# Patient Record
Sex: Female | Born: 1950
Health system: Southern US, Community
[De-identification: ages and names within clinical notes are randomized; demographics above are authoritative.]

## PROBLEM LIST (undated history)

## (undated) ENCOUNTER — Ambulatory Visit: Payer: Medicare HMO

## (undated) DIAGNOSIS — E669 Obesity, unspecified: Secondary | ICD-10-CM

## (undated) DIAGNOSIS — K439 Ventral hernia without obstruction or gangrene: Secondary | ICD-10-CM

## (undated) DIAGNOSIS — C801 Malignant (primary) neoplasm, unspecified: Secondary | ICD-10-CM

## (undated) HISTORY — DX: Obesity, unspecified: E66.9

## (undated) HISTORY — PX: OTHER SURGICAL HISTORY: SHX169

---

## 2004-12-26 ENCOUNTER — Emergency Department (HOSPITAL_COMMUNITY): Admission: EM | Admit: 2004-12-26 | Discharge: 2004-12-27 | Payer: Self-pay | Admitting: Emergency Medicine

## 2015-02-05 ENCOUNTER — Encounter (HOSPITAL_COMMUNITY): Payer: Self-pay | Admitting: *Deleted

## 2015-02-05 ENCOUNTER — Inpatient Hospital Stay (HOSPITAL_COMMUNITY): Payer: BLUE CROSS/BLUE SHIELD

## 2015-02-05 ENCOUNTER — Inpatient Hospital Stay (HOSPITAL_COMMUNITY)
Admission: AD | Admit: 2015-02-05 | Discharge: 2015-02-05 | Disposition: A | Discharge status: Home / Self Care | Payer: BLUE CROSS/BLUE SHIELD | Source: Ambulatory Visit | Attending: Obstetrics & Gynecology | Admitting: Obstetrics & Gynecology

## 2015-02-05 DIAGNOSIS — N950 Postmenopausal bleeding: Secondary | ICD-10-CM | POA: Diagnosis not present

## 2015-02-05 DIAGNOSIS — N939 Abnormal uterine and vaginal bleeding, unspecified: Secondary | ICD-10-CM | POA: Diagnosis present

## 2015-02-05 LAB — CBC
HCT: 29.5 % — ABNORMAL LOW (ref 36.0–46.0)
HEMOGLOBIN: 8.9 g/dL — AB (ref 12.0–15.0)
MCH: 25.7 pg — ABNORMAL LOW (ref 26.0–34.0)
MCHC: 30.2 g/dL (ref 30.0–36.0)
MCV: 85.3 fL (ref 78.0–100.0)
PLATELETS: 283 10*3/uL (ref 150–400)
RBC: 3.46 MIL/uL — ABNORMAL LOW (ref 3.87–5.11)
RDW: 15 % (ref 11.5–15.5)
WBC: 12.8 10*3/uL — ABNORMAL HIGH (ref 4.0–10.5)

## 2015-02-05 LAB — ABO/RH: ABO/RH(D): O NEG

## 2015-02-05 LAB — TYPE AND SCREEN
ABO/RH(D): O NEG
ANTIBODY SCREEN: NEGATIVE

## 2015-02-05 MED ORDER — MEGESTROL ACETATE 40 MG PO TABS: 40.0000 mg | ORAL_TABLET | Freq: Two times a day (BID) | ORAL | Status: DC

## 2015-02-05 NOTE — MAU Provider Note (Signed)
Chief Complaint:  Vaginal Bleeding   First Provider Initiated Contact with Patient 02/05/15 1834     HPI  Ashley Flores is a 65 y.o. [id number] who presents to maternity admissions reporting heavy vaginal bleeding since yesterday.  Has been having postmenopausal bleeding for about a year.  States went into menopause at about age 51 and then a year ago, started having regular bleeding. States they have been "normal" periods, not excessive.  Has mild intermittent cramping with it.  . She reports vaginal bleeding, but no vaginal itching/burning, urinary symptoms, h/a, dizziness, n/v, or fever/chills. Does complain of a bulging at her umbilicus.  Is the same all the time, never gets bigger or painful.   Denies any significant gynecologic history   Normal vaginal births years ago. No major diseases. Had one exploratory lap for pain which found nothing abnormal  Has not seen a doctor for anything in many years "I don't like doctors".  RN Note: Pt presents to MAU with complaints of heavy vaginal bleeding that started yesterday. Pt states she stopped having her cycle 10 years ago, but started having regular cycles again about a year ago. Started bleeding yesterday heavy with large clots. Denies any pain  Past Medical History: History reviewed. No pertinent past medical history.  Past obstetric history: OB History  Gravida Para Term Preterm AB SAB TAB Ectopic Multiple Living  2 2 2       2     # Outcome Date GA Lbr Len/2nd Weight Sex Delivery Anes PTL Lv  2 Term 08/06/78    F Vag-Spont   Y  1 Term 06/25/69    F Vag-Spont  N Y      Past Surgical History: History reviewed. No pertinent past surgical history.  Family History: History reviewed. No pertinent family history.  Social History: Social History  Substance Use Topics  . Smoking status: Never Smoker   . Smokeless tobacco: Never Used  . Alcohol Use: No    Allergies:  Allergies  Allergen Reactions  . Pyridium [Phenazopyridine Hcl]  Cough    Meds:  Prescriptions prior to admission  Medication Sig Dispense Refill Last Dose  . ibuprofen (ADVIL,MOTRIN) 200 MG tablet Take 400-600 mg by mouth every 6 (six) hours as needed for moderate pain.   Past Week at Unknown time    I have reviewed patient's Past Medical Hx, Surgical Hx, Family Hx, Social Hx, medications and allergies.  ROS:  Review of Systems  Constitutional: Positive for fatigue. Negative for fever, chills and appetite change.  Respiratory: Negative for chest tightness and shortness of breath.   Gastrointestinal: Negative for nausea, vomiting, abdominal pain, diarrhea and constipation.  Genitourinary: Positive for vaginal bleeding (heavy with clots), menstrual problem and pelvic pain (mild cramping). Negative for dysuria and vaginal discharge.  Musculoskeletal: Negative for myalgias and back pain.  Neurological: Positive for weakness. Negative for dizziness, light-headedness and headaches.   Other systems negative   Physical Exam  Patient Vitals for the past 24 hrs:  BP Temp Pulse Resp  02/05/15 1816 135/55 mmHg 98.6 F (37 C) 99 18   Filed Vitals:   02/05/15 1816 02/05/15 2048  BP: 135/55 151/60  Pulse: 99 96  Temp: 98.6 F (37 C) 99 F (37.2 C)  TempSrc:  Oral  Resp: 18 16    Constitutional: Well-developed, well-nourished female in no acute distress.  Cardiovascular: normal rate and rhythm, no ectopy audible, S1 & S2 heard, no murmur Respiratory: normal effort, no distress. Lungs CTAB with  no wheezes or crackles GI: Abd soft, non-tender.  Mildly distended vs obese.  No rebound, No guarding.  Bowel Sounds audible   There is a 3cm umbilical hernia, nonincarcerated.  Not tender MS: Extremities nontender, no edema, normal ROM Neurologic: Alert and oriented x 4.   Grossly nonfocal. GU: Neg CVAT. Skin:  Warm and Dry Psych:  Affect appropriate.  PELVIC EXAM: Cervix pink, visually closed, without lesion, moderate clotted blood at os, no active flow,  vaginal walls and external genitalia normal Bimanual exam: Cervix firm, anterior, neg CMT, uterus not palpable due to habitus, nonenlarged, adnexa without tenderness, but exam limited by habitus.    Labs: Results for orders placed or performed during the hospital encounter of 02/05/15 (from the past 24 hour(s))  CBC     Status: Abnormal   Collection Time: 02/05/15  6:55 PM  Result Value Ref Range   WBC 12.8 (H) 4.0 - 10.5 K/uL   RBC 3.46 (L) 3.87 - 5.11 MIL/uL   Hemoglobin 8.9 (L) 12.0 - 15.0 g/dL   HCT 29.5 (L) 36.0 - 46.0 %   MCV 85.3 78.0 - 100.0 fL   MCH 25.7 (L) 26.0 - 34.0 pg   MCHC 30.2 30.0 - 36.0 g/dL   RDW 15.0 11.5 - 15.5 %   Platelets 283 150 - 400 K/uL  Type and screen     Status: None (Preliminary result)   Collection Time: 02/05/15  6:55 PM  Result Value Ref Range   ABO/RH(D) O NEG    Antibody Screen PENDING    Sample Expiration 02/08/2015    --/--/O NEG (02/02 1855)  Imaging:  No results found.  MAU Course/MDM: I have ordered labs as follows:CBC, orthostatic vs Imaging ordered: [place] Results reviewed.   Consult Dr Ihor Dow.  She recommends Rx Megace 40mg  bid and get her into clinic next week for endo bx Treatments in MAU included nothing.   Pt stable at time of discharge.  Assessment: 1. Postmenopausal hemorrhage     Plan: Discharge home Recommend Megace and endometrial biopsy in clinic early next week (appt made for Monday at 3:00) Rx sent for Megace 40mg  PO bid for bleeding  Follow up in clinic    Medication List    ASK your doctor about these medications        ibuprofen 200 MG tablet  Commonly known as:  ADVIL,MOTRIN  Take 400-600 mg by mouth every 6 (six) hours as needed for moderate pain.       Encouraged to return here or to other Urgent Care/ED if she develops worsening of symptoms, increase in pain, fever, or other concerning symptoms.   Hansel Feinstein CNM, MSN Certified Nurse-Midwife 02/05/2015 7:29 PM

## 2015-02-05 NOTE — MAU Note (Signed)
Pt presents to MAU with complaints of heavy vaginal bleeding that started yesterday. Pt states she stopped having her cycle 10 years ago, but started having regular cycles again about a year ago. Started bleeding yesterday heavy with large clots. Denies any pain.

## 2015-02-05 NOTE — Discharge Instructions (Signed)

## 2015-02-09 ENCOUNTER — Other Ambulatory Visit (HOSPITAL_COMMUNITY)
Admission: RE | Admit: 2015-02-09 | Discharge: 2015-02-09 | Disposition: A | Discharge status: Home / Self Care | Payer: BLUE CROSS/BLUE SHIELD | Source: Ambulatory Visit | Attending: Obstetrics & Gynecology | Admitting: Obstetrics & Gynecology

## 2015-02-09 ENCOUNTER — Ambulatory Visit (INDEPENDENT_AMBULATORY_CARE_PROVIDER_SITE_OTHER): Payer: BLUE CROSS/BLUE SHIELD | Admitting: Obstetrics & Gynecology

## 2015-02-09 ENCOUNTER — Encounter: Payer: Self-pay | Admitting: Obstetrics & Gynecology

## 2015-02-09 VITALS — BP 160/75 | HR 97 | Temp 98.2°F | Ht 60.0 in | Wt 260.3 lb

## 2015-02-09 DIAGNOSIS — Z113 Encounter for screening for infections with a predominantly sexual mode of transmission: Secondary | ICD-10-CM

## 2015-02-09 DIAGNOSIS — N950 Postmenopausal bleeding: Secondary | ICD-10-CM | POA: Insufficient documentation

## 2015-02-09 DIAGNOSIS — E669 Obesity, unspecified: Secondary | ICD-10-CM

## 2015-02-09 DIAGNOSIS — C541 Malignant neoplasm of endometrium: Secondary | ICD-10-CM | POA: Diagnosis not present

## 2015-02-09 DIAGNOSIS — Z01419 Encounter for gynecological examination (general) (routine) without abnormal findings: Secondary | ICD-10-CM

## 2015-02-09 LAB — POCT URINALYSIS DIP (DEVICE)
Bilirubin Urine: NEGATIVE
Glucose, UA: NEGATIVE mg/dL
KETONES UR: NEGATIVE mg/dL
Nitrite: NEGATIVE
PH: 5.5 (ref 5.0–8.0)
PROTEIN: NEGATIVE mg/dL
SPECIFIC GRAVITY, URINE: 1.025 (ref 1.005–1.030)
Urobilinogen, UA: 0.2 mg/dL (ref 0.0–1.0)

## 2015-02-09 NOTE — Progress Notes (Signed)
   Subjective:    Patient ID: Ashley Flores, female    DOB: 1950-07-13, 65 y.o.   MRN: [id number]  HPI  28 yo morbidly obese P2  WF with a 1 year h/o PMB is here for a EMBX. She was seen in the MAU for this issue and given megace. An u/s done at that visit showed a 3.6 cm endometrium.   Review of Systems She went through menopause at age 3. She is abstinent    Objective:   Physical Exam WFNAD Breathing and conversing normally  UPT negative, consent signed, time out done Cervix prepped with betadine and grasped with a single tooth tenaculum Uterus sounded to 9 cm Pipelle used for 5passes with a moderate amount of tissue obtained. The first 3 passes only yielded fluid. She tolerated the procedure well.         Assessment & Plan:  PMB- await pathology. She would prefer a phone call over a follow up visit (even if the diagnosis is cancer).

## 2015-02-09 NOTE — Addendum Note (Signed)
Addended by: Michel Harrow on: 02/09/2015 03:43 PM   Modules accepted: Orders

## 2015-02-10 LAB — CYTOLOGY - PAP

## 2015-02-18 ENCOUNTER — Telehealth: Payer: Self-pay | Admitting: *Deleted

## 2015-02-18 DIAGNOSIS — C541 Malignant neoplasm of endometrium: Secondary | ICD-10-CM

## 2015-02-18 NOTE — Telephone Encounter (Signed)
Called pt and informed her of CT Scan appointment made for 2/20 @ 1000 @ [facility]. She will need to pick up contrast media for the exam by Friday 2/17 and will receive instructions at that time for when to drink it. She also has a referral to [facility] from Dr. Hulan Fray and will receive a call within the next few days to tell her the appt date.  Pt voiced understanding.

## 2015-02-19 ENCOUNTER — Telehealth: Payer: Self-pay | Admitting: *Deleted

## 2015-02-19 NOTE — Telephone Encounter (Signed)
Called pt and informed her of appt @ [place] w/Dr. Denman George on 3/1 @ 1045.  She should arrive @ 1015.  Pt stated concern that she will have to wait 9 days to receive results. She would like Dr. Hulan Fray to call her with the CT scan results. I stated that I will send a message to Dr. Hulan Fray with her request. Pt voiced understanding of all information and instructions.

## 2015-02-20 ENCOUNTER — Telehealth: Payer: Self-pay | Admitting: *Deleted

## 2015-02-20 NOTE — Telephone Encounter (Signed)
Received call from Orrin Brigham about patient's CT scan needing pre-authorization.  States patient has called their office very upset.  Patient was originally scheduled for today or Monday and the appointment was moved to Tuesday 02/24/15 because no pre-authorization had been obtained.  I called the patient's [employer] [facility] [place].  I received pre-authorization for CPT codes [phone] CT of chest with and without contrast and [id number] CT abdomen and pelvis with and without contrast.  Diagnosis codes include C54.1 and N95.0.  Pre-authorization code is [id number].  Returned call to [employer] [facility].  Pre-Authorization code given.  Bonnita Nasuti states she will call the patient and have her come in Monday morning to have the CT scan.

## 2015-02-23 ENCOUNTER — Ambulatory Visit (HOSPITAL_COMMUNITY): Payer: BLUE CROSS/BLUE SHIELD

## 2015-02-23 ENCOUNTER — Encounter (HOSPITAL_COMMUNITY): Payer: Self-pay | Admitting: *Deleted

## 2015-02-23 ENCOUNTER — Other Ambulatory Visit: Payer: Self-pay | Admitting: Obstetrics & Gynecology

## 2015-02-23 ENCOUNTER — Encounter (HOSPITAL_COMMUNITY): Payer: Self-pay

## 2015-02-23 ENCOUNTER — Encounter (HOSPITAL_COMMUNITY): Payer: Self-pay | Admitting: Anesthesiology

## 2015-02-23 ENCOUNTER — Ambulatory Visit (HOSPITAL_COMMUNITY)
Admission: RE | Admit: 2015-02-23 | Discharge: 2015-02-23 | Disposition: A | Discharge status: Home / Self Care | Payer: BLUE CROSS/BLUE SHIELD | Source: Ambulatory Visit | Attending: Obstetrics & Gynecology | Admitting: Obstetrics & Gynecology

## 2015-02-23 ENCOUNTER — Telehealth: Payer: Self-pay

## 2015-02-23 ENCOUNTER — Other Ambulatory Visit (HOSPITAL_COMMUNITY): Payer: Self-pay | Admitting: *Deleted

## 2015-02-23 DIAGNOSIS — I7090 Unspecified atherosclerosis: Secondary | ICD-10-CM | POA: Diagnosis not present

## 2015-02-23 DIAGNOSIS — K429 Umbilical hernia without obstruction or gangrene: Secondary | ICD-10-CM | POA: Diagnosis not present

## 2015-02-23 DIAGNOSIS — C541 Malignant neoplasm of endometrium: Secondary | ICD-10-CM

## 2015-02-23 LAB — CREATININE, SERUM: CREATININE: 0.75 mg/dL (ref 0.44–1.00)

## 2015-02-23 LAB — BUN: BUN: 12 mg/dL (ref 6–20)

## 2015-02-23 MED ORDER — IOHEXOL 300 MG/ML  SOLN
100.0000 mL | Freq: Once | INTRAMUSCULAR | Status: AC | PRN
  Administered 2015-02-23: 100 mL via INTRAVENOUS

## 2015-02-23 NOTE — Telephone Encounter (Signed)
Labs have been put in 

## 2015-02-24 ENCOUNTER — Ambulatory Visit (HOSPITAL_COMMUNITY): Payer: BLUE CROSS/BLUE SHIELD

## 2015-02-25 ENCOUNTER — Telehealth: Payer: Self-pay | Admitting: General Practice

## 2015-02-25 DIAGNOSIS — C801 Malignant (primary) neoplasm, unspecified: Secondary | ICD-10-CM

## 2015-02-25 MED ORDER — MEGESTROL ACETATE 40 MG PO TABS: 80.0000 mg | ORAL_TABLET | Freq: Two times a day (BID) | ORAL | Status: DC

## 2015-02-25 NOTE — Telephone Encounter (Signed)
Patient called and left message stating she was scanned on Monday and they told her we would be calling that day with results. Had Dr Hulan Fray review results. Dr Hulan Fray states CT does show area of involvement in the uterus but does not appear to be anywhere else. CT shows umbilical hernia and tiny nodule in lung but appears to be benign. Called patient and apologized for the delay in calling her back and informed her of results. Patient verbalized understanding & states yesterday she did start to bleed a little bit. Spoke to Dr Harolyn Rutherford who recommends megace 80mg  BID until appt with GYN ONC next week. Informed patient of increased dose and new Rx sent to pharmacy. Patient verbalized understanding & had no questions

## 2015-03-04 ENCOUNTER — Ambulatory Visit: Payer: BLUE CROSS/BLUE SHIELD | Attending: Gynecologic Oncology | Admitting: Gynecologic Oncology

## 2015-03-04 ENCOUNTER — Encounter: Payer: Self-pay | Admitting: Gynecologic Oncology

## 2015-03-04 ENCOUNTER — Encounter: Payer: Self-pay | Admitting: *Deleted

## 2015-03-04 VITALS — BP 129/70 | HR 86 | Temp 98.4°F | Resp 18 | Ht 60.0 in | Wt 258.9 lb

## 2015-03-04 DIAGNOSIS — Z809 Family history of malignant neoplasm, unspecified: Secondary | ICD-10-CM | POA: Diagnosis not present

## 2015-03-04 DIAGNOSIS — Z6841 Body Mass Index (BMI) 40.0 and over, adult: Secondary | ICD-10-CM | POA: Diagnosis not present

## 2015-03-04 DIAGNOSIS — Z823 Family history of stroke: Secondary | ICD-10-CM | POA: Diagnosis not present

## 2015-03-04 DIAGNOSIS — C541 Malignant neoplasm of endometrium: Secondary | ICD-10-CM

## 2015-03-04 DIAGNOSIS — K429 Umbilical hernia without obstruction or gangrene: Secondary | ICD-10-CM | POA: Diagnosis not present

## 2015-03-04 DIAGNOSIS — Z8249 Family history of ischemic heart disease and other diseases of the circulatory system: Secondary | ICD-10-CM | POA: Diagnosis not present

## 2015-03-04 NOTE — Progress Notes (Signed)
Consult Note: Gyn-Onc  Consult was requested by Dr. Dove for the evaluation of Ashley Flores 64 y.o. female  CC:  Chief Complaint  Patient presents with  . Endometrial cancer    New Consultation    Assessment/Plan:  Ashley Flores  is a 64 y.o.  year old morbidly obese (BMI 51kg/m2) woman with a grade 3 endometrioid endometrial adenocarcinoma and a large umbilical hernia.    A detailed discussion was held with the patient and her family with regard to to her endometrial cancer diagnosis. We discussed the standard management options for uterine cancer which includes surgery followed possibly by adjuvant therapy depending on the results of surgery. I discussed that she will most likely require adjuvant therapy given the high grade nature of her tumor, even if stage I. The options for surgical management include a hysterectomy and removal of the tubes and ovaries possibly with removal of pelvic and para-aortic lymph nodes. A minimally invasive approach including a robotic hysterectomy hysterectomy have benefits including shorter hospital stay, recovery time and better wound healing. The alternative approach is an open hysterectomy. The patient has been counseled about these surgical options and the risks of surgery in general including infection, bleeding, damage to surrounding structures (including bowel, bladder, ureters, nerves or vessels), and the postoperative risks of PE/ DVT, and lymphedema. I discussed that she is at increased risk for these complications due to her extreme morbid obesity.   I extensively reviewed the additional risks of robotic hysterectomy including possible need for conversion to open laparotomy. I discussed positioning during surgery of trendelenberg and risks of minor facial swelling and care we take in preoperative positioning.  After counseling and consideration of her options, she desires to proceed with robotic hysterectomy, BSO and possible sentinel lymph node  biopsy (lymphadenectomy may not be feasible due to her extreme obesity).   She has a large umbilical hernia containing omentum. We will need to reduce the hernia in order to gain access to the pelvis at the time of surgery. Given the size of the hernia, I would prefer to have an expert in hernia repair perform this procedure, and will consult the surgeons from [facility] to attempt to have them assist with the procedure. I discussed that due to the high-grade nature of her cancer, I would not want to delay the procedure beyond 6 weeks, and if we cannot facilitate a combined approach by that time, we will need to proceed with the hysterectomy portion of the procedure alone.  I discussed that she may require a minilaparotomy for either specimen delivery of the uterus, or for the hernia repair pending surgical findings.  She will be seen by anesthesia for preoperative clearance and discussion of postoperative pain management.  She was given the opportunity to ask questions, which were answered to her satisfaction, and she is agreement with the above mentioned plan of care.  HPI: Ashley Flores is a 64 year old G2P2 morbidly obese woman who is seen in consultation at the request of Dr Dove for grade 3 endometrial cancer (stains favor endometrioid cell type with secretory features. The patient reports postmenopausal bleeding for approximately 3 months. It is becoming very heavy with passage of clots requiring wearing large pads. She underwent an endometrial biopsy with Dr. Dove on 02/09/2015. Pathology revealed grade 3 endometrioid endometrial adenocarcinoma with ER PR receptor positivity and p53 positivity.   Ultrasound scan had been performed on 02/05/2015 and revealed an 11.7 x 5.4 x 6.8 cm   uterus. The endometrial thickness was approximate 4 cm.  A CT scan of the abdomen and pelvis was performed on 02/23/2015. It revealed a 3 mm left lower lobe pulmonary nodule. A 1.9 cm right renal cyst. No  suspicious lymphadenopathy. A thickened endometrium in the uterus. No definite metastatic disease identified. It confirmed a large umbilical hernia containing omentum.  The patient has a known history of an umbilical hernia that she's had for several years. It is not symptomatic other than a noticeable visible bulge at the umbilicus. She is a past surgical history significant for only one prior laparoscopy that was diagnostic approximate 40 years ago for secondary infertility. She's had 2 prior vaginal deliveries. Despite her morbid obesity she does not have other medical comorbidities.  Current Meds:  Outpatient Encounter Prescriptions as of 03/04/2015  Medication Sig  . megestrol (MEGACE) 40 MG tablet Take 2 tablets (80 mg total) by mouth 2 (two) times daily.  . ibuprofen (ADVIL,MOTRIN) 200 MG tablet Take 400-600 mg by mouth every 6 (six) hours as needed for moderate pain. Reported on 03/04/2015   No facility-administered encounter medications on file as of 03/04/2015.    Allergy:  Allergies  Allergen Reactions  . Pyridium [Phenazopyridine Hcl] Cough    Social Hx:   Social History   Social History  . Marital Status: Married    Spouse Name: N/A  . Number of Children: N/A  . Years of Education: N/A   Occupational History  . Not on file.   Social History Main Topics  . Smoking status: Never Smoker   . Smokeless tobacco: Never Used  . Alcohol Use: No  . Drug Use: No  . Sexual Activity: Yes    Birth Control/ Protection: None, Post-menopausal   Other Topics Concern  . Not on file   Social History Narrative    Past Surgical Hx: History reviewed. No pertinent past surgical history.  Past Medical Hx:  Past Medical History  Diagnosis Date  . Obesity     Past Gynecological History:  Infertility remote (secondary). Vaginal deliveries x 2  No LMP recorded. Patient is postmenopausal.  Family Hx:  Family History  Problem Relation Age of Onset  . Cancer Mother   . Hypertension  Mother   . Stroke Mother     Review of Systems:  Constitutional  Feels well,    ENT Normal appearing ears and nares bilaterally Skin/Breast  No rash, sores, jaundice, itching, dryness Cardiovascular  No chest pain, shortness of breath, or edema  Pulmonary  No cough or wheeze.  Gastro Intestinal  No nausea, vomitting, or diarrhoea. No bright red blood per rectum, no abdominal pain, change in bowel movement, or constipation.  Genito Urinary  No frequency, urgency, dysuria, + vaginal bleeding (postmenopausal) Musculo Skeletal  No myalgia, arthralgia, joint swelling or pain  Neurologic  No weakness, numbness, change in gait,  Psychology  No depression, anxiety, insomnia.   Vitals:  Blood pressure 129/70, pulse 86, temperature 98.4 F (36.9 C), temperature source Oral, resp. rate 18, height 5' (1.524 m), weight 258 lb 14.4 oz (117.436 kg), SpO2 100 %.  Physical Exam: WD in NAD Neck  Supple NROM, without any enlargements.  Lymph Node Survey No cervical supraclavicular or inguinal adenopathy Cardiovascular  Pulse normal rate, regularity and rhythm. S1 and S2 normal.  Lungs  Clear to auscultation bilateraly, without wheezes/crackles/rhonchi. Good air movement.  Skin  No rash/lesions/breakdown  Psychiatry  Alert and oriented to person, place, and time  Abdomen  Normoactive bowel   sounds, abdomen soft, non-tender and very obese with 3cm ventral hernia at umbilicus. Not reducible.   Back No CVA tenderness Genito Urinary  Vulva/vagina: Normal external female genitalia.  No lesions. No discharge or bleeding.  Bladder/urethra:  No lesions or masses, well supported bladder  Vagina: normal  Cervix: Normal appearing, no lesions.  Uterus: Bulky, mobile, no parametrial involvement or nodularity.  Adnexa: no palpable masses. Rectal  deferred Extremities  No bilateral cyanosis, clubbing or edema.   Rossi, Emma Caroline, MD  03/04/2015, 5:23 PM      

## 2015-03-04 NOTE — Patient Instructions (Addendum)
Preparing for your Surgery  Plan for surgery on April 4 with Dr. Everitt Amber.  You will be scheduled for a robotic assisted total hysterectomy, bilateral salpingo-oophorectomy, possible sentinel node biopsy.  We will also arrange with [facility] for a hernia repair.  Pre-operative Testing -You will receive a phone call from presurgical testing at [facility] to arrange for a pre-operative testing appointment before your surgery.  This appointment normally occurs one to two weeks before your scheduled surgery.   -Bring your insurance card, copy of an advanced directive if applicable, medication list  -At that visit, you will be asked to sign a consent for a possible blood transfusion in case a transfusion becomes necessary during surgery.  The need for a blood transfusion is rare but having consent is a necessary part of your care.     -You should not be taking blood thinners or aspirin at least ten days prior to surgery unless instructed by your surgeon.  Day Before Surgery at [place] will be asked to take in only clear liquids the day before surgery.  Examples of clear liquids include broths, jello, and clear juices.  Avoid carbonated beverages.  You will be advised to have nothing to eat or drink after midnight the evening before.    Your role in recovery Your role is to become active as soon as directed by your doctor, while still giving yourself time to heal.  Rest when you feel tired. You will be asked to do the following in order to speed your recovery:  - Cough and breathe deeply. This helps toclear and expand your lungs and can prevent pneumonia. You may be given a spirometer to practice deep breathing. A staff member will show you how to use the spirometer. - Do mild physical activity. Walking or moving your legs help your circulation and body functions return to normal. A staff member will help you when you try to walk and will provide you with simple exercises. Do not  try to get up or walk alone the first time. - Actively manage your pain. Managing your pain lets you move in comfort. We will ask you to rate your pain on a scale of zero to 10. It is your responsibility to tell your doctor or nurse where and how much you hurt so your pain can be treated.  Special Considerations -If you are diabetic, you may be placed on insulin after surgery to have closer control over your blood sugars to promote healing and recovery.  This does not mean that you will be discharged on insulin.  If applicable, your oral antidiabetics will be resumed when you are tolerating a solid diet.  -Your final pathology results from surgery should be available by the Friday after surgery and the results will be relayed to you when available.  Blood Transfusion Information WHAT IS A BLOOD TRANSFUSION? A transfusion is the replacement of blood or some of its parts. Blood is made up of multiple cells which provide different functions.  Red blood cells carry oxygen and are used for blood loss replacement.  White blood cells fight against infection.  Platelets control bleeding.  Plasma helps clot blood.  Other blood products are available for specialized needs, such as hemophilia or other clotting disorders. BEFORE THE TRANSFUSION  Who gives blood for transfusions?   You may be able to donate blood to be used at a later date on yourself (autologous donation).  Relatives can be asked to donate blood. This is  generally not any safer than if you have received blood from a stranger. The same precautions are taken to ensure safety when a relative's blood is donated.  Healthy volunteers who are fully evaluated to make sure their blood is safe. This is blood bank blood. Transfusion therapy is the safest it has ever been in the practice of medicine. Before blood is taken from a donor, a complete history is taken to make sure that person has no history of diseases nor engages in risky social  behavior (examples are intravenous drug use or sexual activity with multiple partners). The donor's travel history is screened to minimize risk of transmitting infections, such as malaria. The donated blood is tested for signs of infectious diseases, such as HIV and hepatitis. The blood is then tested to be sure it is compatible with you in order to minimize the chance of a transfusion reaction. If you or a relative donates blood, this is often done in anticipation of surgery and is not appropriate for emergency situations. It takes many days to process the donated blood. RISKS AND COMPLICATIONS Although transfusion therapy is very safe and saves many lives, the main dangers of transfusion include:   Getting an infectious disease.  Developing a transfusion reaction. This is an allergic reaction to something in the blood you were given. Every precaution is taken to prevent this. The decision to have a blood transfusion has been considered carefully by your caregiver before blood is given. Blood is not given unless the benefits outweigh the risks.

## 2015-03-06 ENCOUNTER — Telehealth: Payer: Self-pay | Admitting: Gynecologic Oncology

## 2015-03-06 DIAGNOSIS — K429 Umbilical hernia without obstruction or gangrene: Secondary | ICD-10-CM

## 2015-03-06 NOTE — Telephone Encounter (Signed)
Called to follow up with [facility] about a joint procedure with Dr. Denman George and Dr. Hassell Done.  Message left on Wed.  Patient called the office this am asking about her surgery.  Triage RN stated the surgery scheduler said the patient would have to be seen in the office first, then Dr. Hassell Done would place orders, then they could tell us what dates are available.  Called back to [facility] to make a new pt appt.  Patient would have to see Dr. Hassell Done in [place] in order to get an appt before the projected surgical date of 04/07/15.  First available in [place] would be 04/08/15.  Appt made for March 9 with 1:45pm arrival for a 2:15pm appt.  Called and left message for the patient.  Address of the office: [place] in the [employer].  Advised that the front of the building says [place].  Advised when she walks in to go the right.  Advised to call our office to let us know that she received this message.

## 2015-03-06 NOTE — Telephone Encounter (Signed)
Left a message for Hassan Rowan at [place] about scheduling a joint procedure with Dr. Denman George and Dr. Hassell Done.

## 2015-03-09 ENCOUNTER — Telehealth: Payer: Self-pay | Admitting: Gynecologic Oncology

## 2015-03-09 NOTE — Telephone Encounter (Signed)
Returned call to patient.  Patient wanting to know if Dr. Hassell Done would be available to assist with her surgery for a hernia repair on April 4.  Advised that we had reached out to the office and were awaiting a phone call back.  Also advised that Dr. Denman George would speak with Dr. Hassell Done in person tomorrow during the OR.  At the visit with Dr. Denman George, she was offered a surgical date on March 28 but she wanted to wait until April 4 for Dr. Denman George.    Advised patient she would be contacted when we had confirmed that Dr. Hassell Done could assist.  Also patient stating she does not want to go to her appt in [place] if he will not be able to assist.  Will follow up with patient once additional information is obtained.

## 2015-03-11 ENCOUNTER — Ambulatory Visit: Payer: BLUE CROSS/BLUE SHIELD | Admitting: Gynecologic Oncology

## 2015-03-19 ENCOUNTER — Telehealth: Payer: Self-pay | Admitting: Gynecologic Oncology

## 2015-03-19 NOTE — Telephone Encounter (Signed)
Called and spoke with Hassan Rowan at [facility] in surgery scheduling about the patient's joint procedure on April 4.

## 2015-03-19 NOTE — Telephone Encounter (Signed)
Returned call to patient.  Patient asking about upcoming surgery.  Advised her that we had left messages with [facility] and are waiting to here back from them.  Will call patient with an update.

## 2015-03-24 ENCOUNTER — Telehealth: Payer: Self-pay | Admitting: Gynecologic Oncology

## 2015-03-24 NOTE — Telephone Encounter (Signed)
Call to [facility] to follow up on patient's surgery.  I had called and spoke with Hassan Rowan last week and was to receive a phone call back.  Called to follow up since our office had not received a response.  Message left today.

## 2015-03-24 NOTE — Telephone Encounter (Signed)
Called patient and left message asking if she had received a phone call with an update on her surgery from [place] [facility].  Advised to please call the office.

## 2015-03-26 ENCOUNTER — Telehealth: Payer: Self-pay | Admitting: Gynecologic Oncology

## 2015-03-26 NOTE — Telephone Encounter (Signed)
Returned call to patient.  Informed patient that I had spoken with Debbie at [facility] and confirmed that Dr. Hassell Done would be able to perform the hernia repair on April 4.  Patient voicing concerns about [facility] making her make a payment before they would proceed with scheduling her surgery even though she said her insurance was going to pay 100%.  Advised to call our office for any questions or concerns.

## 2015-03-30 ENCOUNTER — Ambulatory Visit: Payer: Self-pay | Admitting: Surgery

## 2015-03-31 NOTE — Progress Notes (Signed)
CHEST CT 02-23-15 EPIC

## 2015-03-31 NOTE — Patient Instructions (Addendum)
Ashley Flores  03/31/2015   Your procedure is scheduled on: 4-417  Report to [facility] Main  Entrance take [facility]  elevators to 3rd floor to  [place] at 800 AM.  Call this number if you have problems the morning of surgery [phone]   Remember: ONLY 1 PERSON MAY GO WITH YOU TO SHORT STAY TO GET  READY MORNING OF [place].  Do not eat food:After Midnight Sunday night clear liquids all day Monday 04-06-15, no carbonated beverages., no clear liquidds after midnight Monday night.   CLEAR LIQUID DIET   Foods Allowed                                                                     Foods Excluded  Coffee and tea, regular and decaf                             liquids that you cannot  Plain Jell-O in any flavor                                             see through such as: Fruit ices (not with fruit pulp)                                     milk, soups, orange juice  Iced Popsicles                                    All solid food                         Cranberry, grape and apple juices Sports drinks like Gatorade Lightly seasoned clear broth or consume(fat free) Sugar, honey syrup  Sample Menu Breakfast                                Lunch                                     Supper Cranberry juice                    Beef broth                            Chicken broth Jell-O                                     Grape juice  Apple juice Coffee or tea                        Jell-O                                      Popsicle                                                Coffee or tea                        Coffee or tea  _____________________________________________________________________       Take these medicines the morning of surgery with A SIP OF WATER: Megace                               You may not have any metal on your body including hair pins and              piercings  Do not wear jewelry, make-up,  lotions, powders or perfumes, deodorant             Do not wear nail polish.  Do not shave  48 hours prior to surgery.              Men may shave face and neck.   Do not bring valuables to the hospital. [place].  Contacts, dentures or bridgework may not be worn into surgery.  Leave suitcase in the car. After surgery it may be brought to your room.                  Please read over the following fact sheets you were given: _____________________________________________________________________             [facility] - Preparing for Surgery Before surgery, you can play an important role.  Because skin is not sterile, your skin needs to be as free of germs as possible.  You can reduce the number of germs on your skin by washing with CHG (chlorahexidine gluconate) soap before surgery.  CHG is an antiseptic cleaner which kills germs and bonds with the skin to continue killing germs even after washing. Please DO NOT use if you have an allergy to CHG or antibacterial soaps.  If your skin becomes reddened/irritated stop using the CHG and inform your nurse when you arrive at Short Stay. Do not shave (including legs and underarms) for at least 48 hours prior to the first CHG shower.  You may shave your face/neck. Please follow these instructions carefully:  1.  Shower with CHG Soap the night before surgery and the  morning of Surgery.  2.  If you choose to wash your hair, wash your hair first as usual with your  normal  shampoo.  3.  After you shampoo, rinse your hair and body thoroughly to remove the  shampoo.                           4.  Use CHG as you would any  other liquid soap.  You can apply chg directly  to the skin and wash                       Gently with a scrungie or clean washcloth.  5.  Apply the CHG Soap to your body ONLY FROM THE NECK DOWN.   Do not use on face/ open                           Wound or open sores. Avoid contact with  eyes, ears mouth and genitals (private parts).                       Wash face,  Genitals (private parts) with your normal soap.             6.  Wash thoroughly, paying special attention to the area where your surgery  will be performed.  7.  Thoroughly rinse your body with warm water from the neck down.  8.  DO NOT shower/wash with your normal soap after using and rinsing off  the CHG Soap.                9.  Pat yourself dry with a clean towel.            10.  Wear clean pajamas.            11.  Place clean sheets on your bed the night of your first shower and do not  sleep with pets. Day of Surgery : Do not apply any lotions/deodorants the morning of surgery.  Please wear clean clothes to the hospital/surgery center.  FAILURE TO FOLLOW THESE INSTRUCTIONS MAY RESULT IN THE CANCELLATION OF YOUR SURGERY PATIENT SIGNATURE_________________________________  NURSE SIGNATURE__________________________________  ________________________________________________________________________   Adam Phenix  An incentive spirometer is a tool that can help keep your lungs clear and active. This tool measures how well you are filling your lungs with each breath. Taking long deep breaths may help reverse or decrease the chance of developing breathing (pulmonary) problems (especially infection) following:  A long period of time when you are unable to move or be active. BEFORE THE PROCEDURE   If the spirometer includes an indicator to show your best effort, your nurse or respiratory therapist will set it to a desired goal.  If possible, sit up straight or lean slightly forward. Try not to slouch.  Hold the incentive spirometer in an upright position. INSTRUCTIONS FOR USE   Sit on the edge of your bed if possible, or sit up as far as you can in bed or on a chair.  Hold the incentive spirometer in an upright position.  Breathe out normally.  Place the mouthpiece in your mouth and seal your  lips tightly around it.  Breathe in slowly and as deeply as possible, raising the piston or the ball toward the top of the column.  Hold your breath for 3-5 seconds or for as long as possible. Allow the piston or ball to fall to the bottom of the column.  Remove the mouthpiece from your mouth and breathe out normally.  Rest for a few seconds and repeat Steps 1 through 7 at least 10 times every 1-2 hours when you are awake. Take your time and take a few normal breaths between deep breaths.  The spirometer may include an indicator to show your best effort. Use the indicator as  a goal to work toward during each repetition.  After each set of 10 deep breaths, practice coughing to be sure your lungs are clear. If you have an incision (the cut made at the time of surgery), support your incision when coughing by placing a pillow or rolled up towels firmly against it. Once you are able to get out of bed, walk around indoors and cough well. You may stop using the incentive spirometer when instructed by your caregiver.  RISKS AND COMPLICATIONS  Take your time so you do not get dizzy or light-headed.  If you are in pain, you may need to take or ask for pain medication before doing incentive spirometry. It is harder to take a deep breath if you are having pain. AFTER USE  Rest and breathe slowly and easily.  It can be helpful to keep track of a log of your progress. Your caregiver can provide you with a simple table to help with this. If you are using the spirometer at home, follow these instructions: [place] IF:   You are having difficultly using the spirometer.  You have trouble using the spirometer as often as instructed.  Your pain medication is not giving enough relief while using the spirometer.  You develop fever of 100.5 F (38.1 C) or higher. SEEK IMMEDIATE MEDICAL CARE IF:   You cough up bloody sputum that had not been present before.  You develop fever of 102 F (38.9  C) or greater.  You develop worsening pain at or near the incision site. MAKE SURE YOU:   Understand these instructions.  Will watch your condition.  Will get help right away if you are not doing well or get worse. Document Released: 05/02/2006 Document Revised: 03/14/2011 Document Reviewed: 07/03/2006 ExitCare Patient Information 2014 Exit[employer], [place].   ________________________________________________________________________  WHAT IS A BLOOD TRANSFUSION? Blood Transfusion Information  A transfusion is the replacement of blood or some of its parts. Blood is made up of multiple cells which provide different functions.  Red blood cells carry oxygen and are used for blood loss replacement.  White blood cells fight against infection.  Platelets control bleeding.  Plasma helps clot blood.  Other blood products are available for specialized needs, such as hemophilia or other clotting disorders. BEFORE THE TRANSFUSION  Who gives blood for transfusions?   Healthy volunteers who are fully evaluated to make sure their blood is safe. This is blood bank blood. Transfusion therapy is the safest it has ever been in the practice of medicine. Before blood is taken from a donor, a complete history is taken to make sure that person has no history of diseases nor engages in risky social behavior (examples are intravenous drug use or sexual activity with multiple partners). The donor's travel history is screened to minimize risk of transmitting infections, such as malaria. The donated blood is tested for signs of infectious diseases, such as HIV and hepatitis. The blood is then tested to be sure it is compatible with you in order to minimize the chance of a transfusion reaction. If you or a relative donates blood, this is often done in anticipation of surgery and is not appropriate for emergency situations. It takes many days to process the donated blood. RISKS AND COMPLICATIONS Although transfusion  therapy is very safe and saves many lives, the main dangers of transfusion include:   Getting an infectious disease.  Developing a transfusion reaction. This is an allergic reaction to something in the blood you were given.  Every precaution is taken to prevent this. The decision to have a blood transfusion has been considered carefully by your caregiver before blood is given. Blood is not given unless the benefits outweigh the risks. AFTER THE TRANSFUSION  Right after receiving a blood transfusion, you will usually feel much better and more energetic. This is especially true if your red blood cells have gotten low (anemic). The transfusion raises the level of the red blood cells which carry oxygen, and this usually causes an energy increase.  The nurse administering the transfusion will monitor you carefully for complications. HOME CARE INSTRUCTIONS  No special instructions are needed after a transfusion. You may find your energy is better. Speak with your caregiver about any limitations on activity for underlying diseases you may have. SEEK MEDICAL CARE IF:   Your condition is not improving after your transfusion.  You develop redness or irritation at the intravenous (IV) site. SEEK IMMEDIATE MEDICAL CARE IF:  Any of the following symptoms occur over the next 12 hours:  Shaking chills.  You have a temperature by mouth above 102 F (38.9 C), not controlled by medicine.  Chest, back, or muscle pain.  People around you feel you are not acting correctly or are confused.  Shortness of breath or difficulty breathing.  Dizziness and fainting.  You get a rash or develop hives.  You have a decrease in urine output.  Your urine turns a dark color or changes to pink, red, or brown. Any of the following symptoms occur over the next 10 days:  You have a temperature by mouth above 102 F (38.9 C), not controlled by medicine.  Shortness of breath.  Weakness after normal  activity.  The white part of the eye turns yellow (jaundice).  You have a decrease in the amount of urine or are urinating less often.  Your urine turns a dark color or changes to pink, red, or brown. Document Released: 12/18/1999 Document Revised: 03/14/2011 Document Reviewed: 08/06/2007 [facility] Patient Information 2014 [place], [place].  _______________________________________________________________________

## 2015-04-01 ENCOUNTER — Encounter (HOSPITAL_COMMUNITY)
Admission: RE | Admit: 2015-04-01 | Discharge: 2015-04-01 | Disposition: A | Discharge status: Home / Self Care | Payer: BLUE CROSS/BLUE SHIELD | Source: Ambulatory Visit | Attending: Gynecologic Oncology | Admitting: Gynecologic Oncology

## 2015-04-01 ENCOUNTER — Encounter (HOSPITAL_COMMUNITY): Payer: Self-pay

## 2015-04-01 DIAGNOSIS — Z01812 Encounter for preprocedural laboratory examination: Secondary | ICD-10-CM | POA: Diagnosis not present

## 2015-04-01 DIAGNOSIS — C541 Malignant neoplasm of endometrium: Secondary | ICD-10-CM | POA: Insufficient documentation

## 2015-04-01 HISTORY — DX: Malignant (primary) neoplasm, unspecified: C80.1

## 2015-04-01 HISTORY — DX: Ventral hernia without obstruction or gangrene: K43.9

## 2015-04-01 LAB — CBC WITH DIFFERENTIAL/PLATELET
Basophils Absolute: 0.1 10*3/uL (ref 0.0–0.1)
Basophils Relative: 0 %
EOS PCT: 3 %
Eosinophils Absolute: 0.4 10*3/uL (ref 0.0–0.7)
HCT: 34.8 % — ABNORMAL LOW (ref 36.0–46.0)
HEMOGLOBIN: 10.4 g/dL — AB (ref 12.0–15.0)
LYMPHS ABS: 3.8 10*3/uL (ref 0.7–4.0)
Lymphocytes Relative: 24 %
MCH: 22.4 pg — AB (ref 26.0–34.0)
MCHC: 29.9 g/dL — ABNORMAL LOW (ref 30.0–36.0)
MCV: 75 fL — AB (ref 78.0–100.0)
Monocytes Absolute: 1 10*3/uL (ref 0.1–1.0)
Monocytes Relative: 6 %
NEUTROS ABS: 10.7 10*3/uL — AB (ref 1.7–7.7)
NEUTROS PCT: 67 %
Platelets: 413 10*3/uL — ABNORMAL HIGH (ref 150–400)
RBC: 4.64 MIL/uL (ref 3.87–5.11)
RDW: 15.7 % — ABNORMAL HIGH (ref 11.5–15.5)
WBC: 15.9 10*3/uL — ABNORMAL HIGH (ref 4.0–10.5)

## 2015-04-01 LAB — URINALYSIS, ROUTINE W REFLEX MICROSCOPIC
Bilirubin Urine: NEGATIVE
GLUCOSE, UA: NEGATIVE mg/dL
KETONES UR: NEGATIVE mg/dL
Nitrite: NEGATIVE
PROTEIN: NEGATIVE mg/dL
Specific Gravity, Urine: 1.02 (ref 1.005–1.030)
pH: 6 (ref 5.0–8.0)

## 2015-04-01 LAB — ABO/RH: ABO/RH(D): O NEG

## 2015-04-01 LAB — URINE MICROSCOPIC-ADD ON

## 2015-04-01 LAB — COMPREHENSIVE METABOLIC PANEL
ALT: 16 U/L (ref 14–54)
AST: 18 U/L (ref 15–41)
Albumin: 4.2 g/dL (ref 3.5–5.0)
Alkaline Phosphatase: 74 U/L (ref 38–126)
Anion gap: 9 (ref 5–15)
BUN: 15 mg/dL (ref 6–20)
CALCIUM: 9.9 mg/dL (ref 8.9–10.3)
CO2: 25 mmol/L (ref 22–32)
CREATININE: 0.78 mg/dL (ref 0.44–1.00)
Chloride: 105 mmol/L (ref 101–111)
Glucose, Bld: 165 mg/dL — ABNORMAL HIGH (ref 65–99)
Potassium: 5 mmol/L (ref 3.5–5.1)
Sodium: 139 mmol/L (ref 135–145)
TOTAL PROTEIN: 8 g/dL (ref 6.5–8.1)
Total Bilirubin: 0.4 mg/dL (ref 0.3–1.2)

## 2015-04-01 NOTE — Progress Notes (Signed)
Cbc and urine, micro results routed by epic to [place] [facility] np and message left on voice mail for [place] [facility] np

## 2015-04-03 NOTE — Anesthesia Preprocedure Evaluation (Addendum)
Anesthesia Evaluation  Patient identified by MRN, date of birth, ID band Patient awake    Reviewed: Allergy & Precautions, NPO status , Patient's Chart, lab work & pertinent test results  Airway Mallampati: III   Neck ROM: Full    Dental  (+) Teeth Intact, Dental Advisory Given   Pulmonary neg pulmonary ROS,    breath sounds clear to auscultation       Cardiovascular negative cardio ROS   Rhythm:Regular     Neuro/Psych negative neurological ROS  negative psych ROS   GI/Hepatic negative GI ROS, Neg liver ROS,   Endo/Other  negative endocrine ROSMorbid obesity  Renal/GU negative Renal ROS   Endometrial CA negative genitourinary   Musculoskeletal negative musculoskeletal ROS (+)   Abdominal   Peds negative pediatric ROS (+)  Hematology negative hematology ROS (+) 10/35   Anesthesia Other Findings   Reproductive/Obstetrics negative OB ROS                            Anesthesia Physical Anesthesia Plan  ASA: III  Anesthesia Plan: General   Post-op Pain Management:    Induction: Intravenous  Airway Management Planned: Oral ETT  Additional Equipment:   Intra-op Plan:   Post-operative Plan:   Informed Consent: I have reviewed the patients History and Physical, chart, labs and discussed the procedure including the risks, benefits and alternatives for the proposed anesthesia with the patient or authorized representative who has indicated his/her understanding and acceptance.     Plan Discussed with:   Anesthesia Plan Comments: (GLIDE available, multimodal pain RX, 2nd IV to be started, T & S is still valid)       Anesthesia Quick Evaluation

## 2015-04-07 ENCOUNTER — Ambulatory Visit (HOSPITAL_COMMUNITY)
Admission: RE | Admit: 2015-04-07 | Discharge: 2015-04-08 | Disposition: A | Discharge status: Home / Self Care | Payer: BLUE CROSS/BLUE SHIELD | Source: Ambulatory Visit | Attending: Gynecologic Oncology | Admitting: Gynecologic Oncology

## 2015-04-07 ENCOUNTER — Encounter (HOSPITAL_COMMUNITY): Payer: Self-pay | Admitting: *Deleted

## 2015-04-07 ENCOUNTER — Ambulatory Visit (HOSPITAL_COMMUNITY): Payer: BLUE CROSS/BLUE SHIELD | Admitting: Anesthesiology

## 2015-04-07 ENCOUNTER — Encounter (HOSPITAL_COMMUNITY)
Admission: RE | Disposition: A | Discharge status: Home / Self Care | Payer: Self-pay | Source: Ambulatory Visit | Attending: Gynecologic Oncology

## 2015-04-07 DIAGNOSIS — C541 Malignant neoplasm of endometrium: Secondary | ICD-10-CM

## 2015-04-07 DIAGNOSIS — Z6841 Body Mass Index (BMI) 40.0 and over, adult: Secondary | ICD-10-CM | POA: Diagnosis not present

## 2015-04-07 DIAGNOSIS — Z79899 Other long term (current) drug therapy: Secondary | ICD-10-CM | POA: Diagnosis not present

## 2015-04-07 DIAGNOSIS — K420 Umbilical hernia with obstruction, without gangrene: Secondary | ICD-10-CM | POA: Diagnosis not present

## 2015-04-07 HISTORY — PX: VENTRAL HERNIA REPAIR: SHX424

## 2015-04-07 HISTORY — PX: LYMPH NODE BIOPSY: SHX201

## 2015-04-07 HISTORY — PX: ROBOTIC ASSISTED TOTAL HYSTERECTOMY WITH BILATERAL SALPINGO OOPHERECTOMY: SHX6086

## 2015-04-07 LAB — TYPE AND SCREEN
ABO/RH(D): O NEG
Antibody Screen: NEGATIVE

## 2015-04-07 SURGERY — HYSTERECTOMY, TOTAL, ROBOT-ASSISTED, LAPAROSCOPIC, WITH BILATERAL SALPINGO-OOPHORECTOMY
Anesthesia: General

## 2015-04-07 MED ORDER — STERILE WATER FOR INJECTION IJ SOLN
INTRAMUSCULAR | Status: AC
  Filled 2015-04-07: qty 10

## 2015-04-07 MED ORDER — LACTATED RINGERS IV SOLN
INTRAVENOUS | Status: DC | PRN
  Administered 2015-04-07 (×2): via INTRAVENOUS

## 2015-04-07 MED ORDER — METOCLOPRAMIDE HCL 5 MG/ML IJ SOLN
INTRAMUSCULAR | Status: DC | PRN
  Administered 2015-04-07: 10 mg via INTRAVENOUS

## 2015-04-07 MED ORDER — MEPERIDINE HCL 50 MG/ML IJ SOLN: 6.2500 mg | INTRAMUSCULAR | Status: DC | PRN

## 2015-04-07 MED ORDER — MIDAZOLAM HCL 2 MG/2ML IJ SOLN
INTRAMUSCULAR | Status: AC
  Filled 2015-04-07: qty 2

## 2015-04-07 MED ORDER — FENTANYL CITRATE (PF) 250 MCG/5ML IJ SOLN
INTRAMUSCULAR | Status: AC
  Filled 2015-04-07: qty 5

## 2015-04-07 MED ORDER — LACTATED RINGERS IV SOLN
INTRAVENOUS | Status: DC
  Administered 2015-04-07: 09:00:00 via INTRAVENOUS
  Administered 2015-04-07: 1000 mL via INTRAVENOUS

## 2015-04-07 MED ORDER — LACTATED RINGERS IV SOLN: INTRAVENOUS | Status: DC

## 2015-04-07 MED ORDER — ONDANSETRON HCL 4 MG/2ML IJ SOLN
INTRAMUSCULAR | Status: AC
  Filled 2015-04-07: qty 2

## 2015-04-07 MED ORDER — GABAPENTIN 300 MG PO CAPS
600.0000 mg | ORAL_CAPSULE | Freq: Every day | ORAL | Status: DC
  Filled 2015-04-07: qty 2

## 2015-04-07 MED ORDER — ROCURONIUM BROMIDE 100 MG/10ML IV SOLN
INTRAVENOUS | Status: AC
  Filled 2015-04-07: qty 1

## 2015-04-07 MED ORDER — FENTANYL CITRATE (PF) 100 MCG/2ML IJ SOLN
INTRAMUSCULAR | Status: DC | PRN
  Administered 2015-04-07: 50 ug via INTRAVENOUS
  Administered 2015-04-07: 100 ug via INTRAVENOUS
  Administered 2015-04-07 (×2): 50 ug via INTRAVENOUS

## 2015-04-07 MED ORDER — KCL IN DEXTROSE-NACL 20-5-0.45 MEQ/L-%-% IV SOLN
INTRAVENOUS | Status: DC
  Administered 2015-04-07: 18:00:00 via INTRAVENOUS
  Filled 2015-04-07 (×2): qty 1000

## 2015-04-07 MED ORDER — MIDAZOLAM HCL 5 MG/5ML IJ SOLN
INTRAMUSCULAR | Status: DC | PRN
  Administered 2015-04-07: 2 mg via INTRAVENOUS

## 2015-04-07 MED ORDER — OXYCODONE-ACETAMINOPHEN 5-325 MG PO TABS
1.0000 | ORAL_TABLET | ORAL | Status: DC | PRN
  Filled 2015-04-07: qty 1

## 2015-04-07 MED ORDER — HEPARIN SODIUM (PORCINE) 5000 UNIT/ML IJ SOLN: 5000.0000 [IU] | Freq: Once | INTRAMUSCULAR | Status: DC

## 2015-04-07 MED ORDER — PHENYLEPHRINE HCL 10 MG/ML IJ SOLN
INTRAMUSCULAR | Status: DC | PRN
  Administered 2015-04-07 (×5): 80 ug via INTRAVENOUS

## 2015-04-07 MED ORDER — ROCURONIUM BROMIDE 100 MG/10ML IV SOLN
INTRAVENOUS | Status: DC | PRN
  Administered 2015-04-07: 10 mg via INTRAVENOUS
  Administered 2015-04-07: 40 mg via INTRAVENOUS
  Administered 2015-04-07: 10 mg via INTRAVENOUS
  Administered 2015-04-07: 20 mg via INTRAVENOUS
  Administered 2015-04-07: 10 mg via INTRAVENOUS
  Administered 2015-04-07: 5 mg via INTRAVENOUS
  Administered 2015-04-07: 10 mg via INTRAVENOUS

## 2015-04-07 MED ORDER — BUPIVACAINE-EPINEPHRINE (PF) 0.25% -1:200000 IJ SOLN
INTRAMUSCULAR | Status: AC
  Filled 2015-04-07: qty 30

## 2015-04-07 MED ORDER — PROPOFOL 10 MG/ML IV BOLUS
INTRAVENOUS | Status: AC
  Filled 2015-04-07: qty 20

## 2015-04-07 MED ORDER — ENOXAPARIN SODIUM 40 MG/0.4ML ~~LOC~~ SOLN
40.0000 mg | SUBCUTANEOUS | Status: DC
  Administered 2015-04-08: 40 mg via SUBCUTANEOUS
  Filled 2015-04-07: qty 0
  Filled 2015-04-07 (×2): qty 0.4

## 2015-04-07 MED ORDER — PHENYLEPHRINE 40 MCG/ML (10ML) SYRINGE FOR IV PUSH (FOR BLOOD PRESSURE SUPPORT)
PREFILLED_SYRINGE | INTRAVENOUS | Status: AC
  Filled 2015-04-07: qty 10

## 2015-04-07 MED ORDER — ONDANSETRON HCL 4 MG/2ML IJ SOLN
INTRAMUSCULAR | Status: DC | PRN
  Administered 2015-04-07: 4 mg via INTRAVENOUS

## 2015-04-07 MED ORDER — LACTATED RINGERS IR SOLN
Status: DC | PRN
  Administered 2015-04-07: 1000 mL

## 2015-04-07 MED ORDER — CEFAZOLIN SODIUM-DEXTROSE 2-4 GM/100ML-% IV SOLN
2.0000 g | INTRAVENOUS | Status: AC
  Administered 2015-04-07: 2 g via INTRAVENOUS
  Filled 2015-04-07: qty 100

## 2015-04-07 MED ORDER — CEFAZOLIN SODIUM-DEXTROSE 2-3 GM-% IV SOLR
INTRAVENOUS | Status: AC
  Filled 2015-04-07: qty 50

## 2015-04-07 MED ORDER — BUPIVACAINE LIPOSOME 1.3 % IJ SUSP
20.0000 mL | Freq: Once | INTRAMUSCULAR | Status: DC
  Filled 2015-04-07: qty 20

## 2015-04-07 MED ORDER — HYDROMORPHONE HCL 1 MG/ML IJ SOLN
0.2500 mg | INTRAMUSCULAR | Status: DC | PRN
  Administered 2015-04-07: 0.5 mg via INTRAVENOUS

## 2015-04-07 MED ORDER — METOCLOPRAMIDE HCL 5 MG/ML IJ SOLN
INTRAMUSCULAR | Status: AC
  Filled 2015-04-07: qty 2

## 2015-04-07 MED ORDER — HYDROMORPHONE HCL 2 MG/ML IJ SOLN
INTRAMUSCULAR | Status: AC
  Filled 2015-04-07: qty 1

## 2015-04-07 MED ORDER — SUCCINYLCHOLINE CHLORIDE 20 MG/ML IJ SOLN
INTRAMUSCULAR | Status: DC | PRN
  Administered 2015-04-07: 100 mg via INTRAVENOUS

## 2015-04-07 MED ORDER — ENOXAPARIN SODIUM 40 MG/0.4ML ~~LOC~~ SOLN
40.0000 mg | SUBCUTANEOUS | Status: AC
Start: 2015-04-07 — End: 2015-04-07
  Administered 2015-04-07: 40 mg via SUBCUTANEOUS
  Filled 2015-04-07: qty 0.4

## 2015-04-07 MED ORDER — DEXAMETHASONE SODIUM PHOSPHATE 10 MG/ML IJ SOLN
INTRAMUSCULAR | Status: AC
Start: 2015-04-07 — End: 2015-04-07
  Filled 2015-04-07: qty 1

## 2015-04-07 MED ORDER — CETYLPYRIDINIUM CHLORIDE 0.05 % MT LIQD: 7.0000 mL | Freq: Two times a day (BID) | OROMUCOSAL | Status: DC

## 2015-04-07 MED ORDER — SUGAMMADEX SODIUM 500 MG/5ML IV SOLN
INTRAVENOUS | Status: AC
  Filled 2015-04-07: qty 5

## 2015-04-07 MED ORDER — ONDANSETRON HCL 4 MG/2ML IJ SOLN: 4.0000 mg | Freq: Four times a day (QID) | INTRAMUSCULAR | Status: DC | PRN

## 2015-04-07 MED ORDER — IBUPROFEN 800 MG PO TABS: 800.0000 mg | ORAL_TABLET | Freq: Three times a day (TID) | ORAL | Status: DC | PRN

## 2015-04-07 MED ORDER — LIDOCAINE HCL (CARDIAC) 20 MG/ML IV SOLN
INTRAVENOUS | Status: DC | PRN
  Administered 2015-04-07: 50 mg via INTRAVENOUS

## 2015-04-07 MED ORDER — ONDANSETRON HCL 4 MG/2ML IJ SOLN
4.0000 mg | Freq: Four times a day (QID) | INTRAMUSCULAR | Status: DC
Start: 2015-04-07 — End: 2015-04-07

## 2015-04-07 MED ORDER — ONDANSETRON HCL 4 MG PO TABS: 4.0000 mg | ORAL_TABLET | Freq: Four times a day (QID) | ORAL | Status: DC | PRN

## 2015-04-07 MED ORDER — HYDROMORPHONE HCL 1 MG/ML IJ SOLN
INTRAMUSCULAR | Status: DC | PRN
  Administered 2015-04-07 (×3): .4 mg via INTRAVENOUS

## 2015-04-07 MED ORDER — PROPOFOL 10 MG/ML IV BOLUS
INTRAVENOUS | Status: DC | PRN
  Administered 2015-04-07: 170 mg via INTRAVENOUS

## 2015-04-07 MED ORDER — KETOROLAC TROMETHAMINE 15 MG/ML IJ SOLN
15.0000 mg | Freq: Four times a day (QID) | INTRAMUSCULAR | Status: DC
Start: 2015-04-07 — End: 2015-04-08
  Administered 2015-04-07 – 2015-04-08 (×3): 15 mg via INTRAVENOUS
  Filled 2015-04-07 (×11): qty 1

## 2015-04-07 MED ORDER — BUPIVACAINE LIPOSOME 1.3 % IJ SUSP
INTRAMUSCULAR | Status: DC | PRN
  Administered 2015-04-07: 20 mL

## 2015-04-07 MED ORDER — HYDROMORPHONE HCL 1 MG/ML IJ SOLN: 0.2000 mg | INTRAMUSCULAR | Status: DC | PRN

## 2015-04-07 MED ORDER — HYDROMORPHONE HCL 1 MG/ML IJ SOLN
INTRAMUSCULAR | Status: AC
  Filled 2015-04-07: qty 1

## 2015-04-07 MED ORDER — FENTANYL CITRATE (PF) 100 MCG/2ML IJ SOLN: 25.0000 ug | INTRAMUSCULAR | Status: DC | PRN

## 2015-04-07 MED ORDER — ARTIFICIAL TEARS OP OINT
TOPICAL_OINTMENT | OPHTHALMIC | Status: DC | PRN
  Administered 2015-04-07: 1 via OPHTHALMIC

## 2015-04-07 MED ORDER — DEXAMETHASONE SODIUM PHOSPHATE 10 MG/ML IJ SOLN
INTRAMUSCULAR | Status: DC | PRN
  Administered 2015-04-07: 10 mg via INTRAVENOUS

## 2015-04-07 MED ORDER — SUGAMMADEX SODIUM 200 MG/2ML IV SOLN
INTRAVENOUS | Status: DC | PRN
  Administered 2015-04-07: 250 mg via INTRAVENOUS

## 2015-04-07 SURGICAL SUPPLY — 79 items
APPLICATOR SURGIFLO ENDO (HEMOSTASIS) IMPLANT
BINDER ABDOMINAL 12 ML 46-62 (SOFTGOODS) ×3 IMPLANT
CHLORAPREP W/TINT 26ML (MISCELLANEOUS) ×6 IMPLANT
COTTON BALL STERILE (GAUZE/BANDAGES/DRESSINGS) ×3
COTTON BALL STERILE 2 PK (GAUZE/BANDAGES/DRESSINGS) ×2 IMPLANT
COVER SURGICAL LIGHT HANDLE (MISCELLANEOUS) ×3 IMPLANT
COVER TIP SHEARS 8 DVNC (MISCELLANEOUS) ×2 IMPLANT
COVER TIP SHEARS 8MM DA VINCI (MISCELLANEOUS) ×1
DECANTER SPIKE VIAL GLASS SM (MISCELLANEOUS) IMPLANT
DEVICE SECURE STRAP 25 ABSORB (INSTRUMENTS) IMPLANT
DEVICE TROCAR PUNCTURE CLOSURE (ENDOMECHANICALS) ×3 IMPLANT
DISSECTOR BLUNT TIP ENDO 5MM (MISCELLANEOUS) IMPLANT
DRAPE ARM DVNC X/XI (DISPOSABLE) ×8 IMPLANT
DRAPE COLUMN DVNC XI (DISPOSABLE) ×2 IMPLANT
DRAPE DA VINCI XI ARM (DISPOSABLE) ×4
DRAPE DA VINCI XI COLUMN (DISPOSABLE) ×1
DRAPE LAPAROSCOPIC ABDOMINAL (DRAPES) IMPLANT
DRAPE SHEET LG 3/4 BI-LAMINATE (DRAPES) ×6 IMPLANT
DRAPE SURG IRRIG POUCH 19X23 (DRAPES) ×3 IMPLANT
ELECT PENCIL ROCKER SW 15FT (MISCELLANEOUS) ×3 IMPLANT
ELECT REM PT RETURN 9FT ADLT (ELECTROSURGICAL) ×3
ELECTRODE REM PT RTRN 9FT ADLT (ELECTROSURGICAL) ×2 IMPLANT
GAUZE SPONGE 4X4 12PLY STRL (GAUZE/BANDAGES/DRESSINGS) ×3 IMPLANT
GLOVE BIO SURGEON STRL SZ 6 (GLOVE) ×12 IMPLANT
GLOVE BIO SURGEON STRL SZ 6.5 (GLOVE) ×6 IMPLANT
GLOVE BIOGEL M 8.0 STRL (GLOVE) ×6 IMPLANT
GOWN STRL REUS W/ TWL LRG LVL3 (GOWN DISPOSABLE) ×4 IMPLANT
GOWN STRL REUS W/TWL LRG LVL3 (GOWN DISPOSABLE) ×2
GOWN STRL REUS W/TWL XL LVL3 (GOWN DISPOSABLE) ×6 IMPLANT
HOLDER FOLEY CATH W/STRAP (MISCELLANEOUS) IMPLANT
KIT BASIN OR (CUSTOM PROCEDURE TRAY) ×3 IMPLANT
LIQUID BAND (GAUZE/BANDAGES/DRESSINGS) ×3 IMPLANT
MANIPULATOR UTERINE 4.5 ZUMI (MISCELLANEOUS) ×3 IMPLANT
MARKER SKIN DUAL TIP RULER LAB (MISCELLANEOUS) ×3 IMPLANT
MESH VENTRALEX ST 8CM LRG (Mesh General) ×3 IMPLANT
NEEDLE SPNL 22GX3.5 QUINCKE BK (NEEDLE) ×3 IMPLANT
OBTURATOR XI 8MM BLADELESS (TROCAR) ×3 IMPLANT
OCCLUDER COLPOPNEUMO (BALLOONS) ×3 IMPLANT
PAD POSITIONING PINK XL (MISCELLANEOUS) ×3 IMPLANT
PORT ACCESS TROCAR AIRSEAL 12 (TROCAR) ×2 IMPLANT
PORT ACCESS TROCAR AIRSEAL 5M (TROCAR) ×1
POSITIONER SURGICAL ARM (MISCELLANEOUS) IMPLANT
POUCH ENDO CATCH II 15MM (MISCELLANEOUS) ×3 IMPLANT
POUCH SPECIMEN RETRIEVAL 10MM (ENDOMECHANICALS) IMPLANT
SCISSORS LAP 5X45 EPIX DISP (ENDOMECHANICALS) ×3 IMPLANT
SCRUB PCMX 4 OZ (MISCELLANEOUS) IMPLANT
SEAL CANN UNIV 5-8 DVNC XI (MISCELLANEOUS) ×8 IMPLANT
SEAL XI 5MM-8MM UNIVERSAL (MISCELLANEOUS) ×4
SET IRRIG TUBING LAPAROSCOPIC (IRRIGATION / IRRIGATOR) IMPLANT
SET TRI-LUMEN FLTR TB AIRSEAL (TUBING) ×3 IMPLANT
SET TUBE IRRIG SUCTION NO TIP (IRRIGATION / IRRIGATOR) ×3 IMPLANT
SHEARS HARMONIC ACE PLUS 36CM (ENDOMECHANICALS) ×3 IMPLANT
SHEARS HARMONIC ACE PLUS 45CM (MISCELLANEOUS) IMPLANT
SHEET LAVH (DRAPES) ×3 IMPLANT
SLEEVE SURGEON STRL (DRAPES) ×3 IMPLANT
SLEEVE XCEL OPT CAN 5 100 (ENDOMECHANICALS) IMPLANT
SOLUTION ELECTROLUBE (MISCELLANEOUS) ×3 IMPLANT
STAPLER VISISTAT 35W (STAPLE) IMPLANT
SURGIFLO W/THROMBIN 8M KIT (HEMOSTASIS) IMPLANT
SUT MNCRL AB 4-0 PS2 18 (SUTURE) ×6 IMPLANT
SUT NOVA NAB DX-16 0-1 5-0 T12 (SUTURE) ×6 IMPLANT
SUT VIC AB 0 CT1 27 (SUTURE) ×1
SUT VIC AB 0 CT1 27XBRD ANTBC (SUTURE) ×2 IMPLANT
SUT VIC AB 3-0 SH 27 (SUTURE) ×1
SUT VIC AB 3-0 SH 27XBRD (SUTURE) ×2 IMPLANT
SUT VIC AB 4-0 SH 18 (SUTURE) IMPLANT
SYR 50ML LL SCALE MARK (SYRINGE) ×3 IMPLANT
TACKER 5MM HERNIA 3.5CML NAB (ENDOMECHANICALS) IMPLANT
TAPE CLOTH 4X10 WHT NS (GAUZE/BANDAGES/DRESSINGS) IMPLANT
TAPE CLOTH SURG 4X10 WHT LF (GAUZE/BANDAGES/DRESSINGS) ×3 IMPLANT
TOWEL OR 17X26 10 PK STRL BLUE (TOWEL DISPOSABLE) ×6 IMPLANT
TOWEL OR NON WOVEN STRL DISP B (DISPOSABLE) ×3 IMPLANT
TRAP SPECIMEN MUCOUS 40CC (MISCELLANEOUS) IMPLANT
TRAY FOLEY W/METER SILVER 14FR (SET/KITS/TRAYS/PACK) ×3 IMPLANT
TRAY FOLEY W/METER SILVER 16FR (SET/KITS/TRAYS/PACK) IMPLANT
TRAY LAPAROSCOPIC (CUSTOM PROCEDURE TRAY) ×3 IMPLANT
TROCAR BLADELESS OPT 5 100 (ENDOMECHANICALS) IMPLANT
TROCAR XCEL NON-BLD 11X100MML (ENDOMECHANICALS) IMPLANT
WATER STERILE IRR 1500ML POUR (IV SOLUTION) IMPLANT

## 2015-04-07 NOTE — H&P (Signed)
CC:  Chief Complaint  Patient presents with  . Endometrial cancer    New Consultation    Assessment/Plan:  Ms. Ashley Flores is a 65 y.o. year old morbidly obese (BMI 51kg/m2) woman with a grade 3 endometrioid endometrial adenocarcinoma and a large umbilical hernia.    A detailed discussion was held with the patient and her family with regard to to her endometrial cancer diagnosis. We discussed the standard management options for uterine cancer which includes surgery followed possibly by adjuvant therapy depending on the results of surgery. I discussed that she will most likely require adjuvant therapy given the high grade nature of her tumor, even if stage I. The options for surgical management include a hysterectomy and removal of the tubes and ovaries possibly with removal of pelvic and para-aortic lymph nodes. A minimally invasive approach including a robotic hysterectomy hysterectomy have benefits including shorter hospital stay, recovery time and better wound healing. The alternative approach is an open hysterectomy. The patient has been counseled about these surgical options and the risks of surgery in general including infection, bleeding, damage to surrounding structures (including bowel, bladder, ureters, nerves or vessels), and the postoperative risks of PE/ DVT, and lymphedema. I discussed that she is at increased risk for these complications due to her extreme morbid obesity.   I extensively reviewed the additional risks of robotic hysterectomy including possible need for conversion to open laparotomy. I discussed positioning during surgery of trendelenberg and risks of minor facial swelling and care we take in preoperative positioning. After counseling and consideration of her options, she desires to proceed with robotic hysterectomy, BSO and possible sentinel lymph node biopsy (lymphadenectomy may not be feasible due to her extreme obesity).   She has a large umbilical  hernia containing omentum. We will need to reduce the hernia in order to gain access to the pelvis at the time of surgery. Given the size of the hernia, I would prefer to have an expert in hernia repair perform this procedure, and will consult the surgeons from [facility] to attempt to have them assist with the procedure. I discussed that due to the high-grade nature of her cancer, I would not want to delay the procedure beyond 6 weeks, and if we cannot facilitate a combined approach by that time, we will need to proceed with the hysterectomy portion of the procedure alone.  I discussed that she may require a minilaparotomy for either specimen delivery of the uterus, or for the hernia repair pending surgical findings.  She will be seen by anesthesia for preoperative clearance and discussion of postoperative pain management. She was given the opportunity to ask questions, which were answered to her satisfaction, and she is agreement with the above mentioned plan of care.  HPI: Ashley Flores is a 65 year old G2P2 morbidly obese woman who is seen in consultation at the request of Dr Hulan Fray for grade 3 endometrial cancer (stains favor endometrioid cell type with secretory features. The patient reports postmenopausal bleeding for approximately 3 months. It is becoming very heavy with passage of clots requiring wearing large pads. She underwent an endometrial biopsy with Dr. Hulan Fray on 02/09/2015. Pathology revealed grade 3 endometrioid endometrial adenocarcinoma with ER PR receptor positivity and p53 positivity.   Ultrasound scan had been performed on 02/05/2015 and revealed an 11.7 x 5.4 x 6.8 cm uterus. The endometrial thickness was approximate 4 cm.  A CT scan of the abdomen and pelvis was performed on 02/23/2015. It revealed a 3  mm left lower lobe pulmonary nodule. A 1.9 cm right renal cyst. No suspicious lymphadenopathy. A thickened endometrium in the uterus. No definite metastatic disease  identified. It confirmed a large umbilical hernia containing omentum.  The patient has a known history of an umbilical hernia that she's had for several years. It is not symptomatic other than a noticeable visible bulge at the umbilicus. She is a past surgical history significant for only one prior laparoscopy that was diagnostic approximate 40 years ago for secondary infertility. She's had 2 prior vaginal deliveries. Despite her morbid obesity she does not have other medical comorbidities.  Current Meds:  Outpatient Encounter Prescriptions as of 03/04/2015  Medication Sig  . megestrol (MEGACE) 40 MG tablet Take 2 tablets (80 mg total) by mouth 2 (two) times daily.  Marland Kitchen ibuprofen (ADVIL,MOTRIN) 200 MG tablet Take 400-600 mg by mouth every 6 (six) hours as needed for moderate pain. Reported on 03/04/2015   No facility-administered encounter medications on file as of 03/04/2015.    Allergy:  Allergies  Allergen Reactions  . Pyridium [Phenazopyridine Hcl] Cough    Social Hx:  Social History   Social History  . Marital Status: Married    Spouse Name: N/A  . Number of Children: N/A  . Years of Education: N/A   Occupational History  . Not on file.   Social History Main Topics  . Smoking status: Never Smoker   . Smokeless tobacco: Never Used  . Alcohol Use: No  . Drug Use: No  . Sexual Activity: Yes    Birth Control/ Protection: None, Post-menopausal   Other Topics Concern  . Not on file   Social History Narrative    Past Surgical Hx: History reviewed. No pertinent past surgical history.  Past Medical Hx:  Past Medical History  Diagnosis Date  . Obesity     Past Gynecological History: Infertility remote (secondary). Vaginal deliveries x 2 No LMP recorded. Patient is postmenopausal.  Family Hx:  Family History  Problem Relation Age of Onset  . Cancer Mother   . Hypertension  Mother   . Stroke Mother     Review of Systems:  Constitutional  Feels well,  ENT Normal appearing ears and nares bilaterally Skin/Breast  No rash, sores, jaundice, itching, dryness Cardiovascular  No chest pain, shortness of breath, or edema  Pulmonary  No cough or wheeze.  Gastro Intestinal  No nausea, vomitting, or diarrhoea. No bright red blood per rectum, no abdominal pain, change in bowel movement, or constipation.  Genito Urinary  No frequency, urgency, dysuria, + vaginal bleeding (postmenopausal) Musculo Skeletal  No myalgia, arthralgia, joint swelling or pain  Neurologic  No weakness, numbness, change in gait,  Psychology  No depression, anxiety, insomnia.   Vitals: Blood pressure 129/70, pulse 86, temperature 98.4 F (36.9 C), temperature source Oral, resp. rate 18, height 5' (1.524 m), weight 258 lb 14.4 oz (117.436 kg), SpO2 100 %.  Physical Exam: WD in NAD Neck  Supple NROM, without any enlargements.  Lymph Node Survey No cervical supraclavicular or inguinal adenopathy Cardiovascular  Pulse normal rate, regularity and rhythm. S1 and S2 normal.  Lungs  Clear to auscultation bilateraly, without wheezes/crackles/rhonchi. Good air movement.  Skin  No rash/lesions/breakdown  Psychiatry  Alert and oriented to person, place, and time  Abdomen  Normoactive bowel sounds, abdomen soft, non-tender and very obese with 3cm ventral hernia at umbilicus. Not reducible.  Back No CVA tenderness Genito Urinary  Vulva/vagina: Normal external female genitalia. No lesions. No  discharge or bleeding. Bladder/urethra: No lesions or masses, well supported bladder Vagina: normal Cervix: Normal appearing, no lesions. Uterus: Bulky, mobile, no parametrial involvement or nodularity. Adnexa: no palpable masses. Rectal  deferred Extremities  No bilateral cyanosis, clubbing or  edema.   [name], MD

## 2015-04-07 NOTE — Op Note (Signed)
OPERATIVE NOTE 04/07/15  Surgeon: [name]   Assistants: Dr Lahoma Crocker (an MD assistant was necessary for tissue manipulation, management of robotic instrumentation, retraction and positioning due to the complexity of the case and hospital policies).   Anesthesia: General endotracheal anesthesia  ASA Class: 3   Pre-operative Diagnosis: grade 3 endometrial cancer  Post-operative Diagnosis: same  Operation: Robotic-assisted laparoscopic total hysterectomy for uterus >250gm with bilateral salpingoophorectomy and bilateral SLN biopsy  Surgeon: [name]  Assistant Surgeon: Lahoma Crocker MD  Anesthesia: GET  Urine Output: 300  Operative Findings:  : 16cm bulky uterus, no suspicious nodes, no gross extrauterine disease. Extreme abdominal obesity (BMI 49kg/m2) resulting in difficulty visualizing retroperitoneal structures and increasing the complexity of the procedure (extending duration by 433mnutes for additional time to expose the surgical field, increased OR personnel required for patient positioning and exposure).  Omentum contained within ventral hernia at umbilicus (incarcerated).  Estimated Blood Loss:  less than 50 mL      Total IV Fluids: 800 ml         Specimens: right obturator SLN, left external iliac SLN, uterus, cervix, bilateral tubes and ovaries.         Complications:  None; patient tolerated the procedure well.         Disposition: PACU - hemodynamically stable.  Procedure Details  The patient was Flores in the Holding Room. The risks, benefits, complications, treatment options, and expected outcomes were discussed with the patient.  The patient concurred with the proposed plan, giving informed consent.  The site of surgery properly noted/marked. The patient was identified as Ashley Seenand the procedure verified as a Robotic-assisted hysterectomy with bilateral salpingo oophorectomy and SLN biopsy. She was planned to have a hernia  repair with Dr MHassell Doneat the same surgery. A Time Out was held and the above information confirmed.  After induction of anesthesia, the patient was draped and prepped in the usual sterile manner. Pt was placed in supine position after anesthesia and draped and prepped in the usual sterile manner. The abdominal drape was placed after the CholoraPrep had been allowed to dry for 3 minutes.  Her arms were tucked to her side with all appropriate precautions.  The shoulders were stabilized with padded shoulder blocks applied to the acromium processes.  The patient was placed in the semi-lithotomy position in AComptche  The perineum was prepped with Betadine. The patient was then prepped. Foley catheter was placed.  A sterile speculum was placed in the vagina.  The cervix was grasped with a single-tooth tenaculum and dilated with PKennon Roundsdilators. 111mtotal of ICG was injected into the cervical stroma at 2 and 9 o'clock at a 33m47mepth (concentration 0..33mg8m).  The ZUMI uterine manipulator with a medium colpotomizer ring was placed without difficulty.  A pneum occluder balloon was placed over the manipulator.  OG tube placement was confirmed and to suction.   Next, a 5 mm skin incision was made 1 cm below the subcostal margin in the midclavicular line.  The 5 mm Optiview port and scope was used for direct entry.  Opening pressure was under 10 mm CO2.  The abdomen was insufflated and the findings were noted as above.   At this point and all points during the procedure, the patient's intra-abdominal pressure did not exceed 15 mmHg. Next, a 10 mm skin incision was made 2cm above the umbilicus and a right and left port was placed about 10 cm lateral to the  robot port on the right and left side.  A fourth arm was placed in the left lower quadrant 2 cm above and superior and medial to the anterior superior iliac spine.  All ports were placed under direct visualization.  The patient was placed in steep Trendelenburg.   Bowel was folded away into the upper abdomen.  Using the harmonic scalpel, Dr Hassell Done carefully amputated the incarcerated omentum from the hernia sac.  The robot was docked in the normal manner.  The right and left peritoneum were opened parallel to the IP ligament to open the retroperitoneal spaces bilaterally. The SLN mapping was performed in bilateral pelvic basins. The para rectal and paravesical spaces were opened up. Lymphatic channels were identified travelling to the following visualized sentinel lymph node's: right obturator SLN and left external iliac SLN. These SLN's were separated from their surrounding lymphatic tissue, removed and sent for permanent pathology. The pararectal and paravessical spaces were opened bilaterally to ensure no other primary SLN's were present.  The hysterectomy was started after the round ligament on the right side was incised and the retroperitoneum was entered and the pararectal space was developed.  The ureter was noted to be on the medial leaf of the broad ligament.  The peritoneum above the ureter was incised and stretched and the infundibulopelvic ligament was skeletonized, cauterized and cut.  The posterior peritoneum was taken down to the level of the KOH ring.  The anterior peritoneum was also taken down.  The bladder flap was created to the level of the KOH ring.  The uterine artery on the right side was skeletonized, cauterized and cut in the normal manner.  A similar procedure was performed on the left.  The colpotomy was made and the uterus, cervix, bilateral ovaries and tubes were amputated and were attempted to be delivered through the vagina.  This was unsuccessful due to the large size uterus. The uterus was placed within a 15cm endocatch bag and returned into the peritoneal cavity for later retrieval.Pedicles were inspected and excellent hemostasis was achieved. A 1cm area of sigmoid serosa was oversewn where it met an epiploic appendage as this area had  been deserosalized during colonic manipulation (due to the large and heavy colon obstructing the surgical field).   The colpotomy at the vaginal cuff was closed with Vicryl on a CT1 needle in a running manner.  Irrigation was used and excellent hemostasis was achieved.  At this point in the hysterectomy procedure was completed.  Robotic instruments were removed under direct visulaization.  The robot was undocked. Dr Hassell Done continued the procedure with a ventral hernia repair. After he had made a periumbilical incision to repair the hernia, the uterine specimen was retrieved from the peritoneal cavity and sent for permanent pathology.  Sponge, lap and needle counts correct x 2.  The patient was taken to the recovery room in stable condition.  The vagina was swabbed with  minimal bleeding noted.   All instrument and needle counts were correct x  3.   The patient was transferred to the recovery room in a stable condition.  [name], MD
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
## 2015-04-07 NOTE — Op Note (Signed)
Surgeon: Kaylyn Lim, MD, FACS  Asst:  none  Anes:  general  Procedure: Lap assisted repair of large umbilical hernia incarcerated with omental fat mass (about the size of a major league baseball)  Diagnosis: Uterine cancer undergoing robotic Xi TAH/BSO with SLNBx.  Large incarcerated umbilical hernia containing omentum.    Complications: none  EBL:   minimal cc  Drains: none  Description of Procedure:  The patient was taken to OR [facility] at [facility].  After anesthesia was administered and the patient was prepped a timeout was performed by Dr Denman George.  The umbilical hernia was opened with an infraumbilical incision and the large peritoneal sac was stripped from the surrounding skin and tissue.  It and the large mass of incarcerated omentum that we had previously transected with the Harmonic scalpel were removed by cutting the sac at the fascial level.  Dr. Denman George extracted the specimen through this defect which required opening an additional 2 cm to extract.  An 8 cm circular Ventralex ST hernia patch was placed into the abdomen and then incorporated into a transverse closure using #1 Novafil.  After reinflating the abdomen, the mesh was further secured with a secure strap stapler.    The wounds were infiltrated with Exparel and closed with 4-0 monocryl and Dermabond.    The patient tolerated the procedure well and was taken to the PACU in stable condition.     Matt B. Hassell Done, [place], [facility], [place]

## 2015-04-07 NOTE — Transfer of Care (Signed)
Immediate Anesthesia Transfer of Care Note  Patient: Ashley Flores  Procedure(s) Performed: Procedure(s): XI ROBOTIC ASSISTED TOTAL LAPAROSCOPIC  HYSTERECTOMY WITH BILATERAL SALPINGO OOPHORECTOMY (Bilateral)  SENTINEL LYMPH NODE BIOPSY (N/A) LAPAROSCOPIC VENTRAL HERNIA  REPAIR WITH MESH (N/A)  Patient Location: PACU  Anesthesia Type:General  Level of Consciousness: awake and responds to stimulation  Airway & Oxygen Therapy: Patient Spontanous Breathing and Patient connected to face mask  Post-op Assessment: Report given to RN, Post -op Vital signs reviewed and stable and Patient moving all extremities X 4  Post vital signs: Reviewed and stable  Last Vitals:  Filed Vitals:   04/07/15 0823 04/07/15 1433  BP: 159/86 146/70  Pulse: 109 95  Temp: 36.6 C 37.1 C  Resp: 20 22    Complications: No apparent anesthesia complications

## 2015-04-08 DIAGNOSIS — C541 Malignant neoplasm of endometrium: Secondary | ICD-10-CM | POA: Diagnosis not present

## 2015-04-08 LAB — CBC
HEMATOCRIT: 29.9 % — AB (ref 36.0–46.0)
Hemoglobin: 9.1 g/dL — ABNORMAL LOW (ref 12.0–15.0)
MCH: 21.7 pg — AB (ref 26.0–34.0)
MCHC: 30.4 g/dL (ref 30.0–36.0)
MCV: 71.4 fL — AB (ref 78.0–100.0)
PLATELETS: 344 10*3/uL (ref 150–400)
RBC: 4.19 MIL/uL (ref 3.87–5.11)
RDW: 15.6 % — AB (ref 11.5–15.5)
WBC: 19.7 10*3/uL — AB (ref 4.0–10.5)

## 2015-04-08 LAB — BASIC METABOLIC PANEL
ANION GAP: 12 (ref 5–15)
BUN: 17 mg/dL (ref 6–20)
CALCIUM: 9.4 mg/dL (ref 8.9–10.3)
CO2: 23 mmol/L (ref 22–32)
Chloride: 103 mmol/L (ref 101–111)
Creatinine, Ser: 0.94 mg/dL (ref 0.44–1.00)
Glucose, Bld: 216 mg/dL — ABNORMAL HIGH (ref 65–99)
Potassium: 4.9 mmol/L (ref 3.5–5.1)
Sodium: 138 mmol/L (ref 135–145)

## 2015-04-08 MED ORDER — TRAMADOL HCL 50 MG PO TABS: 50.0000 mg | ORAL_TABLET | Freq: Four times a day (QID) | ORAL | Status: DC | PRN

## 2015-04-08 NOTE — Discharge Instructions (Signed)

## 2015-04-08 NOTE — Anesthesia Postprocedure Evaluation (Signed)
Anesthesia Post Note  Patient: Ashley Flores  Procedure(s) Performed: Procedure(s) (LRB): XI ROBOTIC ASSISTED TOTAL LAPAROSCOPIC  HYSTERECTOMY WITH BILATERAL SALPINGO OOPHORECTOMY (Bilateral)  SENTINEL LYMPH NODE BIOPSY (N/A) LAPAROSCOPIC VENTRAL HERNIA  REPAIR WITH MESH (N/A)  Patient location during evaluation: PACU Anesthesia Type: General Level of consciousness: awake and alert Pain management: pain level controlled Vital Signs Assessment: post-procedure vital signs reviewed and stable Respiratory status: spontaneous breathing, nonlabored ventilation, respiratory function stable and patient connected to nasal cannula oxygen Cardiovascular status: blood pressure returned to baseline and stable Postop Assessment: no signs of nausea or vomiting Anesthetic complications: no    Last Vitals:  Filed Vitals:   04/08/15 0134 04/08/15 0526  BP: 152/60 143/68  Pulse: 86 81  Temp: 36.7 C 36.6 C  Resp: 20 18    Last Pain:  Filed Vitals:   04/08/15 0527  PainSc: 2                  Alexis Frock

## 2015-04-08 NOTE — Progress Notes (Signed)
Patient ID: Ashley Flores, female   DOB: 07/23/1950, 64 y.o.   MRN: [id number] [facility] Progress Note:   1 Day Post-Op  Subjective: Mental status is clear.   Objective: Vital signs in last 24 hours: Temp:  [97.6 F (36.4 C)-98.8 F (37.1 C)] 97.9 F (36.6 C) (04/05 0526) Pulse Rate:  [73-98] 81 (04/05 0526) Resp:  [14-22] 18 (04/05 0526) BP: (118-152)/(40-80) 143/68 mmHg (04/05 0526) SpO2:  [95 %-100 %] 97 % (04/05 0526)  Intake/Output from previous day: 04/04 0701 - 04/05 0700 In: 3518.3 [I.V.:3518.3] Out: 1150 [Urine:1050; Blood:100] Intake/Output this shift:    Physical Exam: Work of breathing is normal.  Abdominal binder in place.    Lab Results:  Results for orders placed or performed during the hospital encounter of 04/07/15 (from the past 48 hour(s))  CBC     Status: Abnormal   Collection Time: 04/08/15  4:23 AM  Result Value Ref Range   WBC 19.7 (H) 4.0 - 10.5 K/uL   RBC 4.19 3.87 - 5.11 MIL/uL   Hemoglobin 9.1 (L) 12.0 - 15.0 g/dL   HCT 29.9 (L) 36.0 - 46.0 %   MCV 71.4 (L) 78.0 - 100.0 fL   MCH 21.7 (L) 26.0 - 34.0 pg   MCHC 30.4 30.0 - 36.0 g/dL   RDW 15.6 (H) 11.5 - 15.5 %   Platelets 344 150 - 400 K/uL  Basic metabolic panel     Status: Abnormal   Collection Time: 04/08/15  4:23 AM  Result Value Ref Range   Sodium 138 135 - 145 mmol/L   Potassium 4.9 3.5 - 5.1 mmol/L   Chloride 103 101 - 111 mmol/L   CO2 23 22 - 32 mmol/L   Glucose, Bld 216 (H) 65 - 99 mg/dL   BUN 17 6 - 20 mg/dL   Creatinine, Ser 0.94 0.44 - 1.00 mg/dL   Calcium 9.4 8.9 - 10.3 mg/dL   GFR calc non Af Amer >60 >60 mL/min   GFR calc Af Amer >60 >60 mL/min    Comment: (NOTE) The eGFR has been calculated using the CKD EPI equation. This calculation has not been validated in all clinical situations. eGFR's persistently <60 mL/min signify possible Chronic Kidney Disease.    Anion gap 12 5 - 15    Radiology/Results: No results  found.  Anti-infectives: Anti-infectives    Start     Dose/Rate Route Frequency Ordered Stop   04/07/15 0805  ceFAZolin (ANCEF) IVPB 2g/100 mL premix     2 g 200 mL/hr over 30 Minutes Intravenous On call to O.R. 04/07/15 0805 04/07/15 1022      Assessment/Plan: Problem List: Patient Active Problem List   Diagnosis Date Noted  . Endometrial cancer (HCC) 04/07/2015  . Umbilical hernia, incarcerated 04/07/2015  . Obesity     Umbilical hernia repair after robotic TAH/BSO- Ok for discharge from my viewpoint.  1 Day Post-Op      Matt B. Martin, MD, FACS  [facility] [phone] beeper [phone]  04/08/2015 9:10 AM  

## 2015-04-08 NOTE — Discharge Summary (Signed)
Physician Discharge Summary  Patient ID: Ashley Flores MRN: [id number] DOB/AGE: 1950-05-01 65 y.o.  Admit date: 04/07/2015 Discharge date: 04/08/2015  Admission Diagnoses: Endometrial cancer [facility])  Discharge Diagnoses:  Principal Problem:   Endometrial cancer [facility]) Active Problems:   Umbilical hernia, incarcerated   Discharged Condition:  The patient is in good condition and stable for discharge.    Hospital Course: On 04/07/2015, the patient underwent the following: Procedure(s): XI ROBOTIC ASSISTED TOTAL LAPAROSCOPIC  HYSTERECTOMY WITH BILATERAL SALPINGO OOPHORECTOMY SENTINEL LYMPH NODE BIOPSY LAPAROSCOPIC VENTRAL HERNIA  REPAIR WITH MESH.  The postoperative course was uneventful.  She was discharged to home on postoperative day 1 tolerating a regular diet, voiding without difficulty, minimal pain.  Consults: None  Significant Diagnostic Studies: None  Treatments: surgery: see above  Discharge Exam: Blood pressure 143/68, pulse 81, temperature 97.9 F (36.6 C), temperature source Oral, resp. rate 18, height 5\' 3"  (1.6 m), weight 258 lb (117.028 kg), SpO2 97 %. General appearance: alert, cooperative and no distress Resp: clear to auscultation bilaterally Cardio: regular rate and rhythm, S1, S2 normal, no murmur, click, rub or gallop GI: soft, non-tender; bowel sounds normal; no masses,  no organomegaly and abdomen morbidly obese, binder in place Extremities: extremities normal, atraumatic, no cyanosis or edema Incision/Wound: Lap sites with dressings over the sites intact with no active draining or bleeding  Disposition: 01-Home or Self Care  Discharge Instructions    Call MD for:  difficulty breathing, headache or visual disturbances    Complete by:  As directed      Call MD for:  extreme fatigue    Complete by:  As directed      Call MD for:  hives    Complete by:  As directed      Call MD for:  persistant dizziness or light-headedness    Complete by:  As directed      Call MD for:  persistant nausea and vomiting    Complete by:  As directed      Call MD for:  redness, tenderness, or signs of infection (pain, swelling, redness, odor or green/yellow discharge around incision site)    Complete by:  As directed      Call MD for:  severe uncontrolled pain    Complete by:  As directed      Call MD for:  temperature >100.4    Complete by:  As directed      Diet - low sodium heart healthy    Complete by:  As directed      Driving Restrictions    Complete by:  As directed   No driving for 1 week.  Do not take narcotics and drive.     Increase activity slowly    Complete by:  As directed      Lifting restrictions    Complete by:  As directed   No lifting greater than 10 lbs.     Sexual Activity Restrictions    Complete by:  As directed   No sexual activity, nothing in the vagina, for 6 weeks.            Medication List    STOP taking these medications        megestrol 40 MG tablet  Commonly known as:  MEGACE      TAKE these medications        ibuprofen 200 MG tablet  Commonly known as:  ADVIL,MOTRIN  Take 400-600 mg by mouth every 6 (six) hours as  needed for moderate pain. Reported on 03/04/2015     traMADol 50 MG tablet  Commonly known as:  ULTRAM  Take 1 tablet (50 mg total) by mouth every 6 (six) hours as needed.           Follow-up Information    Follow up with [facility] On 04/10/2015.   Why:  at 10:30am for repeat CBC looking at your white blood count      Follow up with [name], MD On 04/24/2015.   Specialty:  Obstetrics and Gynecology   Why:  at 1:45pm at the [facility] information:   [address] [address] [phone]       Greater than thirty minutes were spend for face to face discharge instructions and discharge orders/summary in EPIC.   Signed: CROSS, MELISSA DEAL 04/08/2015, 9:26 AM

## 2015-04-08 NOTE — Progress Notes (Signed)
Pt's vitals were WNL, pain is under control and diet is tolerated. Discussed discharge instructions with both patient and husband. Discharged to home with prescriptions.

## 2015-04-08 NOTE — Progress Notes (Signed)
Removed foley Catheter at 0520.  Patient tol well.  Unable to document this on Assessment flow sheet.

## 2015-04-09 ENCOUNTER — Other Ambulatory Visit: Payer: Self-pay | Admitting: Gynecologic Oncology

## 2015-04-09 DIAGNOSIS — C541 Malignant neoplasm of endometrium: Secondary | ICD-10-CM

## 2015-04-09 DIAGNOSIS — D72829 Elevated white blood cell count, unspecified: Secondary | ICD-10-CM

## 2015-04-09 NOTE — Progress Notes (Signed)
Per Dr. Denman George, pt coming in for repeat CBC to follow up on WBC count from the hospital.

## 2015-04-10 ENCOUNTER — Other Ambulatory Visit (HOSPITAL_BASED_OUTPATIENT_CLINIC_OR_DEPARTMENT_OTHER): Payer: BLUE CROSS/BLUE SHIELD

## 2015-04-10 ENCOUNTER — Telehealth: Payer: Self-pay | Admitting: Gynecologic Oncology

## 2015-04-10 DIAGNOSIS — C541 Malignant neoplasm of endometrium: Secondary | ICD-10-CM | POA: Diagnosis not present

## 2015-04-10 DIAGNOSIS — D72829 Elevated white blood cell count, unspecified: Secondary | ICD-10-CM | POA: Diagnosis not present

## 2015-04-10 LAB — CBC WITH DIFFERENTIAL/PLATELET
BASO%: 1 % (ref 0.0–2.0)
Basophils Absolute: 0.1 10*3/uL (ref 0.0–0.1)
EOS ABS: 0.4 10*3/uL (ref 0.0–0.5)
EOS%: 3 % (ref 0.0–7.0)
HCT: 32 % — ABNORMAL LOW (ref 34.8–46.6)
HEMOGLOBIN: 9.7 g/dL — AB (ref 11.6–15.9)
LYMPH%: 17.2 % (ref 14.0–49.7)
MCH: 21.5 pg — ABNORMAL LOW (ref 25.1–34.0)
MCHC: 30.1 g/dL — ABNORMAL LOW (ref 31.5–36.0)
MCV: 71.2 fL — AB (ref 79.5–101.0)
MONO#: 0.7 10*3/uL (ref 0.1–0.9)
MONO%: 5.3 % (ref 0.0–14.0)
NEUT%: 73.5 % (ref 38.4–76.8)
NEUTROS ABS: 9.8 10*3/uL — AB (ref 1.5–6.5)
Platelets: 341 10*3/uL (ref 145–400)
RBC: 4.5 10*6/uL (ref 3.70–5.45)
RDW: 17.2 % — AB (ref 11.2–14.5)
WBC: 13.4 10*3/uL — AB (ref 3.9–10.3)
lymph#: 2.3 10*3/uL (ref 0.9–3.3)

## 2015-04-10 LAB — TECHNOLOGIST REVIEW

## 2015-04-10 NOTE — Telephone Encounter (Signed)
Informed patient of findings of stage IA grade 2-3 cancer on final pathology. Low risk factors for recurrence (no LVSI, no myometrial invasion, negative SLN's). Recommend no adjuvant therapy. Recommend close surveillance.  Patients states she is doing well postop.  [name], MD

## 2015-04-17 ENCOUNTER — Telehealth: Payer: Self-pay | Admitting: Gynecologic Oncology

## 2015-04-17 NOTE — Telephone Encounter (Signed)
Returned call to the patient.  She is reporting a minimal amount of serous drainage seen on the waistband of her pants from one or two incision.  No erythema or thick, pus-like drainage, no fever, chills.  Advised this can be a normal finding and advised to monitor.  Reportable signs and symptoms reviewed.  She is to call for any concerns and will follow up next Friday or sooner if needed.  She reports doing well post-operatively.

## 2015-04-24 ENCOUNTER — Ambulatory Visit: Payer: BLUE CROSS/BLUE SHIELD | Attending: Gynecologic Oncology | Admitting: Gynecologic Oncology

## 2015-04-24 ENCOUNTER — Encounter: Payer: Self-pay | Admitting: Gynecologic Oncology

## 2015-04-24 VITALS — BP 146/65 | HR 87 | Temp 98.4°F | Resp 18 | Ht 63.0 in | Wt 253.5 lb

## 2015-04-24 DIAGNOSIS — E669 Obesity, unspecified: Secondary | ICD-10-CM | POA: Diagnosis not present

## 2015-04-24 DIAGNOSIS — Z9889 Other specified postprocedural states: Secondary | ICD-10-CM | POA: Diagnosis not present

## 2015-04-24 DIAGNOSIS — C541 Malignant neoplasm of endometrium: Secondary | ICD-10-CM | POA: Insufficient documentation

## 2015-04-24 DIAGNOSIS — Z79899 Other long term (current) drug therapy: Secondary | ICD-10-CM | POA: Diagnosis not present

## 2015-04-24 DIAGNOSIS — Z90710 Acquired absence of both cervix and uterus: Secondary | ICD-10-CM | POA: Insufficient documentation

## 2015-04-24 NOTE — Progress Notes (Signed)
ENDOMETRIAL CANCER FOLLOW-UP  Assessment:    65 y.o. year old with Stage IA Grade 3 endometrioid endometrial cancer.   S/p robotic assisted total hysterectomy, BSO, bilateral SLN biopsy and umbilical hernia repair on 04/07/15. no LVSI, no myometrial invasion,  Negative pelvic sentinel lymph nodes.   Plan: 1) Pathology reports reviewed today 2) Treatment counseling - Low risk (<5%) for recurrence given age, grade, depth of myometrial invasion and LVSI status. Multidisciplinary tumor board recommendation is for routine surveillance with frequent pelvic exams and visits.  We will start with visits every 6 months x 5 years, then she can return to annual visits.  Discussed signs and symptoms of recurrence including vaginal bleeding or discharge, leg pain or swelling and changes in bowel or bladder habits. She was given the opportunity to ask questions, which were answered to her satisfaction, and she is agreement with the above mentioned plan of care.  3)  Return to clinic in 6 months  HPI:  Ashley Flores is a 65 y.o. year old G2P2002 initially seen in consultation on 03/04/15 referred by Dr Hulan Fray for grade 3 endometrioid endometrial cancer.  She then underwent a robotic assisted total hysterectomy, BSO, sentinel lymph node biopsy and umbilical hernia repair (with Dr Hassell Done) on [id number] without complications.  Her postoperative course was uncomplicated.  Her final pathologic diagnosis is a Stage IA Grade 3 endometrioid endometrial cancer with no lymphovascular space invasion, 0/15 mm (no) myometrial invasion and negative sentinel lymph nodes. Her tumor was large (6cm) and the uterus required intact specimen removal via the hernia site incision.  She is seen today for a postoperative check and to discuss her pathology results and treatment plan.  Since discharge from the hospital, she is feeling well.  She has improving appetite, normal bowel and bladder function, and pain controlled with minimal PO medication.  She has no other complaints today.  Past Medical History  Diagnosis Date  . Obesity   . Cancer [facility])     endometrial cancer  . Ventral hernia    Past Surgical History  Procedure Laterality Date  . Laparoscopic surgery for fertility  40 yrs ago  . Robotic assisted total hysterectomy with bilateral salpingo oopherectomy Bilateral 04/07/2015    Procedure: XI ROBOTIC ASSISTED TOTAL LAPAROSCOPIC  HYSTERECTOMY WITH BILATERAL SALPINGO OOPHORECTOMY;  Surgeon: Everitt Amber, MD;  Location: [facility];  Service: Gynecology;  Laterality: Bilateral;  . Lymph node biopsy N/A 04/07/2015    Procedure:  SENTINEL LYMPH NODE BIOPSY;  Surgeon: Everitt Amber, MD;  Location: [facility];  Service: Gynecology;  Laterality: N/A;  . Ventral hernia repair N/A 04/07/2015    Procedure: LAPAROSCOPIC VENTRAL HERNIA  REPAIR WITH MESH;  Surgeon: Johnathan Hausen, MD;  Location: [facility];  Service: General;  Laterality: N/A;   Family History  Problem Relation Age of Onset  . Cancer Mother   . Hypertension Mother   . Stroke Mother    Social History   Social History  . Marital Status: Married    Spouse Name: N/A  . Number of Children: N/A  . Years of Education: N/A   Occupational History  . Not on file.   Social History Main Topics  . Smoking status: Never Smoker   . Smokeless tobacco: Never Used  . Alcohol Use: No  . Drug Use: No  . Sexual Activity: Yes    Birth Control/ Protection: None, Post-menopausal   Other Topics Concern  . Not on file   Social History Narrative   Current Outpatient  Prescriptions on File Prior to Visit  Medication Sig Dispense Refill  . ibuprofen (ADVIL,MOTRIN) 200 MG tablet Take 400-600 mg by mouth every 6 (six) hours as needed for moderate pain. Reported on 04/24/2015     No current facility-administered medications on file prior to visit.   Allergies  Allergen Reactions  . Pyridium [Phenazopyridine Hcl] Other (See Comments)    Made pt feel out of it, room spinning, hallucinations.       Review of systems: Constitutional:  She has no weight gain or weight loss. She has no fever or chills. Eyes: No blurred vision Ears, Nose, Mouth, Throat: No dizziness, headaches or changes in hearing. No mouth sores. Cardiovascular: No chest pain, palpitations or edema. Respiratory:  No shortness of breath, wheezing or cough Gastrointestinal: She has normal bowel movements without diarrhea or constipation. She denies any nausea or vomiting. She denies blood in her stool or heart burn. Genitourinary:  She denies pelvic pain, pelvic pressure or changes in her urinary function. She has no hematuria, dysuria, or incontinence. She has no irregular vaginal bleeding or vaginal discharge Musculoskeletal: Denies muscle weakness or joint pains.  Skin:  She has no skin changes, rashes or itching Neurological:  Denies dizziness or headaches. No neuropathy, no numbness or tingling. Psychiatric:  She denies depression or anxiety. Hematologic/Lymphatic:   No easy bruising or bleeding   Physical Exam: Blood pressure 146/65, pulse 87, temperature 98.4 F (36.9 C), temperature source Oral, resp. rate 18, height 5\' 3"  (1.6 m), weight 253 lb 8 oz (114.987 kg). General: Well dressed, well nourished in no apparent distress.   HEENT:  Normocephalic and atraumatic, no lesions.  Extraocular muscles intact. Sclerae anicteric. Pupils equal, round, reactive. No mouth sores or ulcers. Thyroid is normal size, not nodular, midline. Abdomen:  Soft, nontender, nondistended.  No palpable masses.  No hepatosplenomegaly.  No ascites. Normal bowel sounds.  No hernias.  Incisions are well healed (especially hernia site) Genitourinary: Normal EGBUS  Vaginal cuff intact.  No bleeding or discharge.  No cul de sac fullness. Extremities: No cyanosis, clubbing or edema.  No calf tenderness or erythema. No palpable cords. Psychiatric: Mood and affect are appropriate. Neurological: Awake, alert and oriented x 3. Sensation is  intact, no neuropathy.  Musculoskeletal: No pain, normal strength and range of motion.   20 minutes of direct face to face counseling time was spent with the patient. This included discussion about prognosis, therapy recommendations and postoperative side effects and are beyond the scope of routine postoperative care.  [name], MD

## 2015-04-24 NOTE — Patient Instructions (Signed)
Plan to follow up in six months or sooner if needed.  Please call for any questions, concerns, new/persistent symptoms including vaginal bleeding or abdominal pain.

## 2015-10-28 ENCOUNTER — Encounter: Payer: Self-pay | Admitting: Gynecologic Oncology

## 2015-10-28 ENCOUNTER — Ambulatory Visit: Payer: BLUE CROSS/BLUE SHIELD | Attending: Gynecologic Oncology | Admitting: Gynecologic Oncology

## 2015-10-28 VITALS — BP 131/70 | HR 74 | Temp 98.3°F | Resp 19 | Wt 263.3 lb

## 2015-10-28 DIAGNOSIS — Z8542 Personal history of malignant neoplasm of other parts of uterus: Secondary | ICD-10-CM

## 2015-10-28 DIAGNOSIS — K439 Ventral hernia without obstruction or gangrene: Secondary | ICD-10-CM | POA: Diagnosis not present

## 2015-10-28 DIAGNOSIS — C541 Malignant neoplasm of endometrium: Secondary | ICD-10-CM | POA: Diagnosis present

## 2015-10-28 DIAGNOSIS — Z90710 Acquired absence of both cervix and uterus: Secondary | ICD-10-CM | POA: Diagnosis not present

## 2015-10-28 DIAGNOSIS — E669 Obesity, unspecified: Secondary | ICD-10-CM | POA: Insufficient documentation

## 2015-10-28 NOTE — Progress Notes (Signed)
ENDOMETRIAL CANCER FOLLOW-UP  Assessment:    65 y.o. year old with Stage IA Grade 3 endometrioid endometrial cancer.   S/p robotic assisted total hysterectomy, BSO, bilateral SLN biopsy and umbilical hernia repair on 04/07/15. no LVSI, no myometrial invasion,  Negative pelvic sentinel lymph nodes.  Clinically free of disease on exam.   Plan: 1) Pathology reports reviewed today 2) Treatment counseling - Low risk (<5%) for recurrence given age, grade, depth of myometrial invasion and LVSI status. Multidisciplinary tumor board recommendation is for routine surveillance with frequent pelvic exams and visits.  We will continue with visits every 6 months x 5 years, then she can return to annual visits in April, 2022.  Discussed signs and symptoms of recurrence including vaginal bleeding or discharge, leg pain or swelling and changes in bowel or bladder habits. She was given the opportunity to ask questions, which were answered to her satisfaction, and she is agreement with the above mentioned plan of care.  3)  Return to clinic in 6 months  HPI:  Ashley Flores is a 65 y.o. year old G2P2002 initially seen in consultation on 03/04/15 referred by Dr Hulan Fray for grade 3 endometrioid endometrial cancer.  She then underwent a robotic assisted total hysterectomy, BSO, sentinel lymph node biopsy and umbilical hernia repair (with Dr Hassell Done) on Q000111Q without complications.  Her postoperative course was uncomplicated.  Her final pathologic diagnosis is a Stage IA Grade 3 endometrioid endometrial cancer with no lymphovascular space invasion, 0/15 mm (no) myometrial invasion and negative sentinel lymph nodes. Her tumor was large (6cm) and the uterus required intact specimen removal via the hernia site incision.  Interval Hx: She is seen today for a routine surveillance check. She has stable  appetite, normal bowel and bladder function, and no pain or bleeding.   Past Medical History:  Diagnosis Date  . Cancer [facility])     endometrial cancer  . Obesity   . Ventral hernia    Past Surgical History:  Procedure Laterality Date  . laparoscopic surgery for fertility  40 yrs ago  . LYMPH NODE BIOPSY N/A 04/07/2015   Procedure:  SENTINEL LYMPH NODE BIOPSY;  Surgeon: Everitt Amber, MD;  Location: [facility];  Service: Gynecology;  Laterality: N/A;  . ROBOTIC ASSISTED TOTAL HYSTERECTOMY WITH BILATERAL SALPINGO OOPHERECTOMY Bilateral 04/07/2015   Procedure: XI ROBOTIC ASSISTED TOTAL LAPAROSCOPIC  HYSTERECTOMY WITH BILATERAL SALPINGO OOPHORECTOMY;  Surgeon: Everitt Amber, MD;  Location: [facility];  Service: Gynecology;  Laterality: Bilateral;  . VENTRAL HERNIA REPAIR N/A 04/07/2015   Procedure: LAPAROSCOPIC VENTRAL HERNIA  REPAIR WITH MESH;  Surgeon: Johnathan Hausen, MD;  Location: [facility];  Service: General;  Laterality: N/A;   Family History  Problem Relation Age of Onset  . Cancer Mother   . Hypertension Mother   . Stroke Mother    Social History   Social History  . Marital status: Married    Spouse name: N/A  . Number of children: N/A  . Years of education: N/A   Occupational History  . Not on file.   Social History Main Topics  . Smoking status: Never Smoker  . Smokeless tobacco: Never Used  . Alcohol use No  . Drug use: No  . Sexual activity: Yes    Birth control/ protection: None, Post-menopausal   Other Topics Concern  . Not on file   Social History Narrative  . No narrative on file   Current Outpatient Prescriptions on File Prior to Visit  Medication Sig Dispense Refill  .  ibuprofen (ADVIL,MOTRIN) 200 MG tablet Take 400-600 mg by mouth every 6 (six) hours as needed for moderate pain. Reported on 04/24/2015     No current facility-administered medications on file prior to visit.    Allergies  Allergen Reactions  . Pyridium [Phenazopyridine Hcl] Other (See Comments)    Made pt feel out of it, room spinning, hallucinations.      Review of systems: Constitutional:  She has no weight gain or weight  loss. She has no fever or chills. Eyes: No blurred vision Ears, Nose, Mouth, Throat: No dizziness, headaches or changes in hearing. No mouth sores. Cardiovascular: No chest pain, palpitations or edema. Respiratory:  No shortness of breath, wheezing or cough Gastrointestinal: She has normal bowel movements without diarrhea or constipation. She denies any nausea or vomiting. She denies blood in her stool or heart burn. Genitourinary:  She denies pelvic pain, pelvic pressure or changes in her urinary function. She has no hematuria, dysuria, or incontinence. She has no irregular vaginal bleeding or vaginal discharge Musculoskeletal: Denies muscle weakness or joint pains.  Skin:  She has no skin changes, rashes or itching Neurological:  Denies dizziness or headaches. No neuropathy, no numbness or tingling. Psychiatric:  She denies depression or anxiety. Hematologic/Lymphatic:   No easy bruising or bleeding   Physical Exam: Blood pressure 131/70, pulse 74, temperature 98.3 F (36.8 C), temperature source Oral, resp. rate 19, weight 263 lb 4.8 oz (119.4 kg), SpO2 99 %. General: Well dressed, well nourished in no apparent distress.   HEENT:  Normocephalic and atraumatic, no lesions.  Extraocular muscles intact. Sclerae anicteric. Pupils equal, round, reactive. No mouth sores or ulcers. Thyroid is normal size, not nodular, midline. Abdomen:  Soft, nontender, nondistended.  No palpable masses.  No hepatosplenomegaly.  No ascites. Normal bowel sounds.  No hernias.  Incisions are well healed (especially hernia site) - no recurrent hernia appreciated. Genitourinary: Normal EGBUS  Vaginal cuff intact.  No bleeding or discharge.  No cul de sac fullness. Extremities: No cyanosis, clubbing or edema.  No calf tenderness or erythema. No palpable cords. Psychiatric: Mood and affect are appropriate. Neurological: Awake, alert and oriented x 3. Sensation is intact, no neuropathy.  Musculoskeletal: No pain, normal  strength and range of motion.  [name], MD

## 2015-10-28 NOTE — Patient Instructions (Signed)
Plan to follow up in 6 months with Dr Everitt Amber . Call with any new onset of vaginal bleeding or persistant pain or any changes.  Thank you

## 2016-04-25 ENCOUNTER — Encounter: Payer: Self-pay | Admitting: Gynecologic Oncology

## 2016-04-25 ENCOUNTER — Ambulatory Visit: Payer: BLUE CROSS/BLUE SHIELD | Attending: Gynecologic Oncology | Admitting: Gynecologic Oncology

## 2016-04-25 VITALS — BP 148/66 | HR 81 | Temp 98.4°F | Resp 20 | Wt 265.1 lb

## 2016-04-25 DIAGNOSIS — Z8542 Personal history of malignant neoplasm of other parts of uterus: Secondary | ICD-10-CM

## 2016-04-25 DIAGNOSIS — Z9079 Acquired absence of other genital organ(s): Secondary | ICD-10-CM | POA: Insufficient documentation

## 2016-04-25 DIAGNOSIS — C541 Malignant neoplasm of endometrium: Secondary | ICD-10-CM | POA: Diagnosis not present

## 2016-04-25 DIAGNOSIS — E669 Obesity, unspecified: Secondary | ICD-10-CM | POA: Insufficient documentation

## 2016-04-25 DIAGNOSIS — Z90722 Acquired absence of ovaries, bilateral: Secondary | ICD-10-CM | POA: Diagnosis not present

## 2016-04-25 DIAGNOSIS — Z90710 Acquired absence of both cervix and uterus: Secondary | ICD-10-CM

## 2016-04-25 DIAGNOSIS — Z9889 Other specified postprocedural states: Secondary | ICD-10-CM | POA: Insufficient documentation

## 2016-04-25 NOTE — Patient Instructions (Signed)
Please return to see Dr Denman George in 6 months for follow-up.  Call for an appointment if you have bleeding, new swelling or new abdominal pains.  You should see your primary care doctor once a year for a well woman check to ensure all other cancer screenings are up to date.

## 2016-04-25 NOTE — Progress Notes (Signed)
ENDOMETRIAL CANCER FOLLOW-UP  Assessment:    66 y.o. year old with Stage IA Grade 3 endometrioid endometrial cancer.   S/p robotic assisted total hysterectomy, BSO, bilateral SLN biopsy and umbilical hernia repair on 04/07/15. no LVSI, no myometrial invasion,  Negative pelvic sentinel lymph nodes.  Clinically free of disease on exam.  Plan: We will continue with visits every 6 months x 5 years, then she can return to annual visits in April, 2022.  Discussed signs and symptoms of recurrence including vaginal bleeding or discharge, leg pain or swelling and changes in bowel or bladder habits. She was given the opportunity to ask questions, which were answered to her satisfaction, and she is agreement with the above mentioned plan of care.  Return to clinic in 6 months  HPI:  Ashley Flores is a 66 y.o. year old G2P2002 initially seen in consultation on 03/04/15 referred by Dr Hulan Fray for grade 3 endometrioid endometrial cancer.  She then underwent a robotic assisted total hysterectomy, BSO, sentinel lymph node biopsy and umbilical hernia repair (with Dr Hassell Done) on 07/08/17 without complications.  Her postoperative course was uncomplicated.  Her final pathologic diagnosis is a Stage IA Grade 3 endometrioid endometrial cancer with no lymphovascular space invasion, 0/15 mm (no) myometrial invasion and negative sentinel lymph nodes. Her tumor was large (6cm) and the uterus required intact specimen removal via the hernia site incision.  Interval Hx: She is seen today for a routine surveillance check. She has stable  appetite, normal bowel and bladder function, and no pain or bleeding.   Past Medical History:  Diagnosis Date  . Cancer [facility])    endometrial cancer  . Obesity   . Ventral hernia    Past Surgical History:  Procedure Laterality Date  . laparoscopic surgery for fertility  40 yrs ago  . LYMPH NODE BIOPSY N/A 04/07/2015   Procedure:  SENTINEL LYMPH NODE BIOPSY;  Surgeon: Everitt Amber, MD;  Location:  [facility];  Service: Gynecology;  Laterality: N/A;  . ROBOTIC ASSISTED TOTAL HYSTERECTOMY WITH BILATERAL SALPINGO OOPHERECTOMY Bilateral 04/07/2015   Procedure: XI ROBOTIC ASSISTED TOTAL LAPAROSCOPIC  HYSTERECTOMY WITH BILATERAL SALPINGO OOPHORECTOMY;  Surgeon: Everitt Amber, MD;  Location: [facility];  Service: Gynecology;  Laterality: Bilateral;  . VENTRAL HERNIA REPAIR N/A 04/07/2015   Procedure: LAPAROSCOPIC VENTRAL HERNIA  REPAIR WITH MESH;  Surgeon: Johnathan Hausen, MD;  Location: [facility];  Service: General;  Laterality: N/A;   Family History  Problem Relation Age of Onset  . Cancer Mother   . Hypertension Mother   . Stroke Mother    Social History   Social History  . Marital status: Married    Spouse name: N/A  . Number of children: N/A  . Years of education: N/A   Occupational History  . Not on file.   Social History Main Topics  . Smoking status: Never Smoker  . Smokeless tobacco: Never Used  . Alcohol use No  . Drug use: No  . Sexual activity: Yes    Birth control/ protection: None, Post-menopausal   Other Topics Concern  . Not on file   Social History Narrative  . No narrative on file   Current Outpatient Prescriptions on File Prior to Visit  Medication Sig Dispense Refill  . ibuprofen (ADVIL,MOTRIN) 200 MG tablet Take 400-600 mg by mouth every 6 (six) hours as needed for moderate pain. Reported on 04/24/2015     No current facility-administered medications on file prior to visit.    Allergies  Allergen  Reactions  . Pyridium [Phenazopyridine Hcl] Other (See Comments)    Made pt feel out of it, room spinning, hallucinations.      Review of systems: Constitutional:  She has no weight gain or weight loss. She has no fever or chills. Eyes: No blurred vision Ears, Nose, Mouth, Throat: No dizziness, headaches or changes in hearing. No mouth sores. Cardiovascular: No chest pain, palpitations or edema. Respiratory:  No shortness of breath, wheezing or cough Gastrointestinal:  She has normal bowel movements without diarrhea or constipation. She denies any nausea or vomiting. She denies blood in her stool or heart burn. Genitourinary:  She denies pelvic pain, pelvic pressure or changes in her urinary function. She has no hematuria, dysuria, or incontinence. She has no irregular vaginal bleeding or vaginal discharge Musculoskeletal: Denies muscle weakness or joint pains.  Skin:  She has no skin changes, rashes or itching Neurological:  Denies dizziness or headaches. No neuropathy, no numbness or tingling. Psychiatric:  She denies depression or anxiety. Hematologic/Lymphatic:   No easy bruising or bleeding   Physical Exam: Blood pressure (!) 148/66, pulse 81, temperature 98.4 F (36.9 C), temperature source Oral, resp. rate 20, weight 265 lb 1.6 oz (120.2 kg). General: Well dressed, well nourished in no apparent distress.   HEENT:  Normocephalic and atraumatic, no lesions.  Extraocular muscles intact. Sclerae anicteric. Pupils equal, round, reactive. No mouth sores or ulcers. Thyroid is normal size, not nodular, midline. Abdomen:  Soft, nontender, nondistended.  No palpable masses.  No hepatosplenomegaly.  No ascites. Normal bowel sounds.  No hernias.  Incisions are well healed (especially hernia site) - no recurrent hernia appreciated. Genitourinary: Normal EGBUS  Vaginal cuff intact.  No bleeding or discharge.  No cul de sac fullness. Extremities: No cyanosis, clubbing or edema.  No calf tenderness or erythema. No palpable cords. Psychiatric: Mood and affect are appropriate. Neurological: Awake, alert and oriented x 3. Sensation is intact, no neuropathy.  Musculoskeletal: No pain, normal strength and range of motion.  [name], MD

## 2016-10-24 ENCOUNTER — Ambulatory Visit: Payer: BLUE CROSS/BLUE SHIELD | Attending: Gynecologic Oncology | Admitting: Gynecologic Oncology

## 2016-10-24 ENCOUNTER — Encounter: Payer: Self-pay | Admitting: Gynecologic Oncology

## 2016-10-24 VITALS — BP 142/64 | HR 86 | Temp 97.8°F | Resp 20 | Ht 62.5 in | Wt 263.3 lb

## 2016-10-24 DIAGNOSIS — Z90710 Acquired absence of both cervix and uterus: Secondary | ICD-10-CM

## 2016-10-24 DIAGNOSIS — Z8542 Personal history of malignant neoplasm of other parts of uterus: Secondary | ICD-10-CM

## 2016-10-24 DIAGNOSIS — Z08 Encounter for follow-up examination after completed treatment for malignant neoplasm: Secondary | ICD-10-CM | POA: Diagnosis not present

## 2016-10-24 DIAGNOSIS — C541 Malignant neoplasm of endometrium: Secondary | ICD-10-CM

## 2016-10-24 NOTE — Progress Notes (Signed)
ENDOMETRIAL CANCER FOLLOW-UP  Assessment:    66 y.o. year old with Stage IA Grade 3 endometrioid endometrial cancer.   S/p robotic assisted total hysterectomy, BSO, bilateral SLN biopsy and umbilical hernia repair on 04/07/15. no LVSI, no myometrial invasion,  Negative pelvic sentinel lymph nodes.  Clinically free of disease on exam.  Plan: We will continue with visits every 6 months x 5 years, then she can return to annual visits in April, 2022.  Discussed signs and symptoms of recurrence including vaginal bleeding or discharge, leg pain or swelling and changes in bowel or bladder habits. She was given the opportunity to ask questions, which were answered to her satisfaction, and she is agreement with the above mentioned plan of care.  Return to clinic in 6 months  HPI:  Ashley Flores is a 66 y.o. year old G2P2002 initially seen in consultation on 03/04/15 referred by Dr Hulan Fray for grade 3 endometrioid endometrial cancer.  She then underwent a robotic assisted total hysterectomy, BSO, sentinel lymph node biopsy and umbilical hernia repair (with Dr Hassell Done) on 06/07/76 without complications.  Her postoperative course was uncomplicated.  Her final pathologic diagnosis is a Stage IA Grade 3 endometrioid endometrial cancer with no lymphovascular space invasion, 0/15 mm (no) myometrial invasion and negative sentinel lymph nodes. Her tumor was large (6cm) and the uterus required intact specimen removal via the hernia site incision.  Interval Hx: She is seen today for a routine surveillance check. She has stable  appetite, normal bowel and bladder function, and no pain or bleeding.   Past Medical History:  Diagnosis Date  . Cancer [facility])    endometrial cancer  . Obesity   . Ventral hernia    Past Surgical History:  Procedure Laterality Date  . laparoscopic surgery for fertility  40 yrs ago  . LYMPH NODE BIOPSY N/A 04/07/2015   Procedure:  SENTINEL LYMPH NODE BIOPSY;  Surgeon: Everitt Amber, MD;  Location:  [facility];  Service: Gynecology;  Laterality: N/A;  . ROBOTIC ASSISTED TOTAL HYSTERECTOMY WITH BILATERAL SALPINGO OOPHERECTOMY Bilateral 04/07/2015   Procedure: XI ROBOTIC ASSISTED TOTAL LAPAROSCOPIC  HYSTERECTOMY WITH BILATERAL SALPINGO OOPHORECTOMY;  Surgeon: Everitt Amber, MD;  Location: [facility];  Service: Gynecology;  Laterality: Bilateral;  . VENTRAL HERNIA REPAIR N/A 04/07/2015   Procedure: LAPAROSCOPIC VENTRAL HERNIA  REPAIR WITH MESH;  Surgeon: Johnathan Hausen, MD;  Location: [facility];  Service: General;  Laterality: N/A;   Family History  Problem Relation Age of Onset  . Cancer Mother   . Hypertension Mother   . Stroke Mother    Social History   Social History  . Marital status: Married    Spouse name: N/A  . Number of children: N/A  . Years of education: N/A   Occupational History  . Not on file.   Social History Main Topics  . Smoking status: Never Smoker  . Smokeless tobacco: Never Used  . Alcohol use No  . Drug use: No  . Sexual activity: Yes    Birth control/ protection: None, Post-menopausal   Other Topics Concern  . Not on file   Social History Narrative  . No narrative on file   Current Outpatient Prescriptions on File Prior to Visit  Medication Sig Dispense Refill  . ibuprofen (ADVIL,MOTRIN) 200 MG tablet Take 400-600 mg by mouth every 6 (six) hours as needed for moderate pain. Reported on 04/24/2015     No current facility-administered medications on file prior to visit.    Allergies  Allergen  Reactions  . Pyridium [Phenazopyridine Hcl] Other (See Comments)    Made pt feel out of it, room spinning, hallucinations.      Review of systems: Constitutional:  She has no weight gain or weight loss. She has no fever or chills. Eyes: No blurred vision Ears, Nose, Mouth, Throat: No dizziness, headaches or changes in hearing. No mouth sores. Cardiovascular: No chest pain, palpitations or edema. Respiratory:  No shortness of breath, wheezing or cough Gastrointestinal:  She has normal bowel movements without diarrhea or constipation. She denies any nausea or vomiting. She denies blood in her stool or heart burn. Genitourinary:  She denies pelvic pain, pelvic pressure or changes in her urinary function. She has no hematuria, dysuria, or incontinence. She has no irregular vaginal bleeding or vaginal discharge Musculoskeletal: Denies muscle weakness or joint pains.  Skin:  She has no skin changes, rashes or itching Neurological:  Denies dizziness or headaches. No neuropathy, no numbness or tingling. Psychiatric:  She denies depression or anxiety. Hematologic/Lymphatic:   No easy bruising or bleeding   Physical Exam: Blood pressure (!) 142/64, pulse 86, temperature 97.8 F (36.6 C), temperature source Oral, resp. rate 20, height 5' 2.5" (1.588 m), weight 263 lb 4.8 oz (119.4 kg), SpO2 97 %. General: Well dressed, well nourished in no apparent distress.   HEENT:  Normocephalic and atraumatic, no lesions.  Extraocular muscles intact. Sclerae anicteric. Pupils equal, round, reactive. No mouth sores or ulcers. Thyroid is normal size, not nodular, midline. Abdomen:  Soft, nontender, nondistended.  No palpable masses.  No hepatosplenomegaly.  No ascites. Normal bowel sounds.  No hernias.  Incisions are well healed (especially hernia site) - no recurrent hernia appreciated. Genitourinary: Normal EGBUS  Vaginal cuff intact.  No bleeding or discharge.  No cul de sac fullness. Extremities: No cyanosis, clubbing or edema.  No calf tenderness or erythema. No palpable cords. Psychiatric: Mood and affect are appropriate. Neurological: Awake, alert and oriented x 3. Sensation is intact, no neuropathy.  Musculoskeletal: No pain, normal strength and range of motion.  [name], MD

## 2016-10-24 NOTE — Patient Instructions (Signed)
Please notify Dr Denman George at phone number [phone] if you notice vaginal bleeding, new pelvic or abdominal pains, bloating, feeling full easy, or a change in bladder or bowel function.   Please return to see Dr Denman George in April, 2019.  Contact her office in the new year (2019) to schedule this appointment.

## 2017-01-10 ENCOUNTER — Telehealth: Payer: Self-pay | Admitting: *Deleted

## 2017-01-10 NOTE — Telephone Encounter (Signed)
Patient called and scheduled her 6 month follow up for April 10th at 1:30pm

## 2017-04-12 ENCOUNTER — Inpatient Hospital Stay: Payer: BLUE CROSS/BLUE SHIELD | Attending: Gynecologic Oncology | Admitting: Gynecologic Oncology

## 2017-04-12 ENCOUNTER — Encounter: Payer: Self-pay | Admitting: Gynecologic Oncology

## 2017-04-12 VITALS — BP 145/71 | HR 82 | Temp 98.2°F | Resp 20 | Wt 263.0 lb

## 2017-04-12 DIAGNOSIS — C541 Malignant neoplasm of endometrium: Secondary | ICD-10-CM | POA: Insufficient documentation

## 2017-04-12 DIAGNOSIS — Z90710 Acquired absence of both cervix and uterus: Secondary | ICD-10-CM | POA: Insufficient documentation

## 2017-04-12 DIAGNOSIS — Z90722 Acquired absence of ovaries, bilateral: Secondary | ICD-10-CM | POA: Insufficient documentation

## 2017-04-12 NOTE — Progress Notes (Signed)
ENDOMETRIAL CANCER FOLLOW-UP  Assessment:    67 y.o. year old with Stage IA Grade 3 endometrioid endometrial cancer.   S/p robotic assisted total hysterectomy, BSO, bilateral SLN biopsy and umbilical hernia repair on 04/07/15. no LVSI, no myometrial invasion,  Negative pelvic sentinel lymph nodes.  Clinically free of disease on exam.  Plan: We will continue with visits every 6 months x 5 years, then she can return to annual visits in April, 2022.  Discussed signs and symptoms of recurrence including vaginal bleeding or discharge, leg pain or swelling and changes in bowel or bladder habits. She was given the opportunity to ask questions, which were answered to her satisfaction, and she is agreement with the above mentioned plan of care.  Return to clinic in 6 months  HPI:  Ashley Flores is a 67 y.o. year old G2P2002 initially seen in consultation on 03/04/15 referred by Dr Hulan Fray for grade 3 endometrioid endometrial cancer.  She then underwent a robotic assisted total hysterectomy, BSO, sentinel lymph node biopsy and umbilical hernia repair (with Dr Hassell Done) on 08/04/40 without complications.  Her postoperative course was uncomplicated.  Her final pathologic diagnosis is a Stage IA Grade 3 endometrioid endometrial cancer with no lymphovascular space invasion, 0/15 mm (no) myometrial invasion and negative sentinel lymph nodes. Her tumor was large (6cm) and the uterus required intact specimen removal via the hernia site incision.  Interval Hx: She is seen today for a routine surveillance check. She has stable  appetite, normal bowel and bladder function, and no pain or bleeding.   Past Medical History:  Diagnosis Date  . Cancer [facility])    endometrial cancer  . Obesity   . Ventral hernia    Past Surgical History:  Procedure Laterality Date  . laparoscopic surgery for fertility  40 yrs ago  . LYMPH NODE BIOPSY N/A 04/07/2015   Procedure:  SENTINEL LYMPH NODE BIOPSY;  Surgeon: Everitt Amber, MD;  Location:  [facility];  Service: Gynecology;  Laterality: N/A;  . ROBOTIC ASSISTED TOTAL HYSTERECTOMY WITH BILATERAL SALPINGO OOPHERECTOMY Bilateral 04/07/2015   Procedure: XI ROBOTIC ASSISTED TOTAL LAPAROSCOPIC  HYSTERECTOMY WITH BILATERAL SALPINGO OOPHORECTOMY;  Surgeon: Everitt Amber, MD;  Location: [facility];  Service: Gynecology;  Laterality: Bilateral;  . VENTRAL HERNIA REPAIR N/A 04/07/2015   Procedure: LAPAROSCOPIC VENTRAL HERNIA  REPAIR WITH MESH;  Surgeon: Johnathan Hausen, MD;  Location: [facility];  Service: General;  Laterality: N/A;   Family History  Problem Relation Age of Onset  . Cancer Mother   . Hypertension Mother   . Stroke Mother    Social History   Socioeconomic History  . Marital status: Married    Spouse name: Not on file  . Number of children: Not on file  . Years of education: Not on file  . Highest education level: Not on file  Occupational History  . Not on file  Social Needs  . Financial resource strain: Not on file  . Food insecurity:    Worry: Not on file    Inability: Not on file  . Transportation needs:    Medical: Not on file    Non-medical: Not on file  Tobacco Use  . Smoking status: Never Smoker  . Smokeless tobacco: Never Used  Substance and Sexual Activity  . Alcohol use: No  . Drug use: No  . Sexual activity: Yes    Birth control/protection: None, Post-menopausal  Lifestyle  . Physical activity:    Days per week: Not on file  Minutes per session: Not on file  . Stress: Not on file  Relationships  . Social connections:    Talks on phone: Not on file    Gets together: Not on file    Attends religious service: Not on file    Active member of club or organization: Not on file    Attends meetings of clubs or organizations: Not on file    Relationship status: Not on file  . Intimate partner violence:    Fear of current or ex partner: Not on file    Emotionally abused: Not on file    Physically abused: Not on file    Forced sexual activity: Not on file   Other Topics Concern  . Not on file  Social History Narrative  . Not on file   Current Outpatient Medications on File Prior to Visit  Medication Sig Dispense Refill  . ibuprofen (ADVIL,MOTRIN) 200 MG tablet Take 400-600 mg by mouth every 6 (six) hours as needed for moderate pain. Reported on 04/24/2015     No current facility-administered medications on file prior to visit.    Allergies  Allergen Reactions  . Pyridium [Phenazopyridine Hcl] Other (See Comments)    Made pt feel out of it, room spinning, hallucinations.      Review of systems: Constitutional:  She has no weight gain or weight loss. She has no fever or chills. Eyes: No blurred vision Ears, Nose, Mouth, Throat: No dizziness, headaches or changes in hearing. No mouth sores. Cardiovascular: No chest pain, palpitations or edema. Respiratory:  No shortness of breath, wheezing or cough Gastrointestinal: She has normal bowel movements without diarrhea or constipation. She denies any nausea or vomiting. She denies blood in her stool or heart burn. Genitourinary:  She denies pelvic pain, pelvic pressure or changes in her urinary function. She has no hematuria, dysuria, or incontinence. She has no irregular vaginal bleeding or vaginal discharge Musculoskeletal: Denies muscle weakness or joint pains.  Skin:  She has no skin changes, rashes or itching Neurological:  Denies dizziness or headaches. No neuropathy, no numbness or tingling. Psychiatric:  She denies depression or anxiety. Hematologic/Lymphatic:   No easy bruising or bleeding   Physical Exam: Blood pressure (!) 145/71, pulse 82, temperature 98.2 F (36.8 C), temperature source Oral, resp. rate 20, weight 263 lb (119.3 kg), SpO2 100 %. General: Well dressed, well nourished in no apparent distress.   HEENT:  Normocephalic and atraumatic, no lesions.  Extraocular muscles intact. Sclerae anicteric. Pupils equal, round, reactive. No mouth sores or ulcers. Thyroid is normal  size, not nodular, midline. Abdomen:  Soft, nontender, nondistended.  No palpable masses.  No hepatosplenomegaly.  No ascites. Normal bowel sounds.  No hernias.  Incisions are well healed (especially hernia site) - no recurrent hernia appreciated. Genitourinary: Normal EGBUS  Vaginal cuff intact.  No bleeding or discharge.  No cul de sac fullness. Extremities: No cyanosis, clubbing or edema.  No calf tenderness or erythema. No palpable cords. Psychiatric: Mood and affect are appropriate. Neurological: Awake, alert and oriented x 3. Sensation is intact, no neuropathy.  Musculoskeletal: No pain, normal strength and range of motion.  Thereasa Solo, MD

## 2017-04-12 NOTE — Patient Instructions (Signed)
Please notify Dr Denman George at phone number [phone] if you notice vaginal bleeding, new pelvic or abdominal pains, bloating, feeling full easy, or a change in bladder or bowel function.   Please return to see Dr Denman George in October as scheduled.

## 2017-05-09 DIAGNOSIS — R300 Dysuria: Secondary | ICD-10-CM | POA: Diagnosis not present

## 2017-05-09 DIAGNOSIS — N3000 Acute cystitis without hematuria: Secondary | ICD-10-CM | POA: Diagnosis not present

## 2017-10-16 ENCOUNTER — Inpatient Hospital Stay: Payer: BLUE CROSS/BLUE SHIELD | Attending: Gynecologic Oncology | Admitting: Gynecologic Oncology

## 2017-10-16 ENCOUNTER — Encounter: Payer: Self-pay | Admitting: Gynecologic Oncology

## 2017-10-16 VITALS — BP 158/81 | HR 77 | Temp 98.4°F | Resp 18 | Ht 62.5 in | Wt 261.1 lb

## 2017-10-16 DIAGNOSIS — Z90722 Acquired absence of ovaries, bilateral: Secondary | ICD-10-CM | POA: Diagnosis not present

## 2017-10-16 DIAGNOSIS — Z90710 Acquired absence of both cervix and uterus: Secondary | ICD-10-CM | POA: Diagnosis not present

## 2017-10-16 DIAGNOSIS — C541 Malignant neoplasm of endometrium: Secondary | ICD-10-CM | POA: Diagnosis not present

## 2017-10-16 NOTE — Patient Instructions (Signed)
Please notify Dr Denman George at phone number [phone] if you notice vaginal bleeding, new pelvic or abdominal pains, bloating, feeling full easy, or a change in bladder or bowel function.   Please contact Dr Serita Grit office in November or later to schedule an appointment with her in April 2020.

## 2017-10-16 NOTE — Progress Notes (Signed)
ENDOMETRIAL CANCER FOLLOW-UP  Assessment:    67 y.o. year old with Stage IA Grade 3 endometrioid endometrial cancer.   S/p robotic assisted total hysterectomy, BSO, bilateral SLN biopsy and umbilical hernia repair on 04/07/15. no LVSI, no myometrial invasion,  Negative pelvic sentinel lymph nodes.  Clinically free of disease on exam.  Plan: We will continue with visits every 6 months x 5 years, then she can return to annual visits in April, 2022.  Discussed signs and symptoms of recurrence including vaginal bleeding or discharge, leg pain or swelling and changes in bowel or bladder habits. She was given the opportunity to ask questions, which were answered to her satisfaction, and she is agreement with the above mentioned plan of care.  Return to clinic in 6 months  HPI:  Ashley Flores is a 67 y.o. year old G2P2002 initially seen in consultation on 03/04/15 referred by Dr Hulan Fray for grade 3 endometrioid endometrial cancer.  She then underwent a robotic assisted total hysterectomy, BSO, sentinel lymph node biopsy and umbilical hernia repair (with Dr Hassell Done) on 0/1/02 without complications.  Her postoperative course was uncomplicated.  Her final pathologic diagnosis is a Stage IA Grade 3 endometrioid endometrial cancer with no lymphovascular space invasion, 0/15 mm (no) myometrial invasion and negative sentinel lymph nodes. Her tumor was large (6cm) and the uterus required intact specimen removal via the hernia site incision.  Interval Hx: She is seen today for a routine surveillance check. She has stable  appetite, normal bowel and bladder function, and no pain or bleeding.   Past Medical History:  Diagnosis Date  . Cancer [facility])    endometrial cancer  . Obesity   . Ventral hernia    Past Surgical History:  Procedure Laterality Date  . laparoscopic surgery for fertility  40 yrs ago  . LYMPH NODE BIOPSY N/A 04/07/2015   Procedure:  SENTINEL LYMPH NODE BIOPSY;  Surgeon: Everitt Amber, MD;  Location:  [facility];  Service: Gynecology;  Laterality: N/A;  . ROBOTIC ASSISTED TOTAL HYSTERECTOMY WITH BILATERAL SALPINGO OOPHERECTOMY Bilateral 04/07/2015   Procedure: XI ROBOTIC ASSISTED TOTAL LAPAROSCOPIC  HYSTERECTOMY WITH BILATERAL SALPINGO OOPHORECTOMY;  Surgeon: Everitt Amber, MD;  Location: [facility];  Service: Gynecology;  Laterality: Bilateral;  . VENTRAL HERNIA REPAIR N/A 04/07/2015   Procedure: LAPAROSCOPIC VENTRAL HERNIA  REPAIR WITH MESH;  Surgeon: Johnathan Hausen, MD;  Location: [facility];  Service: General;  Laterality: N/A;   Family History  Problem Relation Age of Onset  . Cancer Mother   . Hypertension Mother   . Stroke Mother    Social History   Socioeconomic History  . Marital status: Married    Spouse name: Not on file  . Number of children: Not on file  . Years of education: Not on file  . Highest education level: Not on file  Occupational History  . Not on file  Social Needs  . Financial resource strain: Not on file  . Food insecurity:    Worry: Not on file    Inability: Not on file  . Transportation needs:    Medical: Not on file    Non-medical: Not on file  Tobacco Use  . Smoking status: Never Smoker  . Smokeless tobacco: Never Used  Substance and Sexual Activity  . Alcohol use: No  . Drug use: No  . Sexual activity: Yes    Birth control/protection: None, Post-menopausal  Lifestyle  . Physical activity:    Days per week: Not on file  Minutes per session: Not on file  . Stress: Not on file  Relationships  . Social connections:    Talks on phone: Not on file    Gets together: Not on file    Attends religious service: Not on file    Active member of club or organization: Not on file    Attends meetings of clubs or organizations: Not on file    Relationship status: Not on file  . Intimate partner violence:    Fear of current or ex partner: Not on file    Emotionally abused: Not on file    Physically abused: Not on file    Forced sexual activity: Not on file   Other Topics Concern  . Not on file  Social History Narrative  . Not on file   Current Outpatient Medications on File Prior to Visit  Medication Sig Dispense Refill  . Chlorphen-Phenyleph-ASA (ALKA-SELTZER PLUS COLD) 2-7.8-325 MG TBEF Take 1 tablet by mouth as needed.    Marland Kitchen ibuprofen (ADVIL,MOTRIN) 200 MG tablet Take 400-600 mg by mouth every 6 (six) hours as needed for moderate pain. Reported on 04/24/2015     No current facility-administered medications on file prior to visit.    Allergies  Allergen Reactions  . Pyridium [Phenazopyridine Hcl] Other (See Comments)    Made pt feel out of it, room spinning, hallucinations.      Review of systems: Constitutional:  She has no weight gain or weight loss. She has no fever or chills. Eyes: No blurred vision Ears, Nose, Mouth, Throat: No dizziness, headaches or changes in hearing. No mouth sores. Cardiovascular: No chest pain, palpitations or edema. Respiratory:  No shortness of breath, wheezing or cough Gastrointestinal: She has normal bowel movements without diarrhea or constipation. She denies any nausea or vomiting. She denies blood in her stool or heart burn. Genitourinary:  She denies pelvic pain, pelvic pressure or changes in her urinary function. She has no hematuria, dysuria, or incontinence. She has no irregular vaginal bleeding or vaginal discharge Musculoskeletal: Denies muscle weakness or joint pains.  Skin:  She has no skin changes, rashes or itching Neurological:  Denies dizziness or headaches. No neuropathy, no numbness or tingling. Psychiatric:  She denies depression or anxiety. Hematologic/Lymphatic:   No easy bruising or bleeding   Physical Exam: Blood pressure (!) 158/81, pulse 77, temperature 98.4 F (36.9 C), temperature source Oral, resp. rate 18, height 5' 2.5" (1.588 m), weight 261 lb 1.6 oz (118.4 kg), SpO2 95 %. General: Well dressed, well nourished in no apparent distress.   HEENT:  Normocephalic and  atraumatic, no lesions.  Extraocular muscles intact. Sclerae anicteric. Pupils equal, round, reactive. No mouth sores or ulcers. Thyroid is normal size, not nodular, midline. Abdomen:  Soft, nontender, nondistended.  No palpable masses.  No hepatosplenomegaly.  No ascites. Normal bowel sounds.  No hernias.  Incisions are well healed (especially hernia site) - no recurrent hernia appreciated. Genitourinary: Normal EGBUS  Vaginal cuff intact.  No bleeding or discharge.  No cul de sac fullness. Atrophy of vaginal tissues and some prolapse/enterocele.  Extremities: No cyanosis, clubbing or edema.  No calf tenderness or erythema. No palpable cords. Psychiatric: Mood and affect are appropriate. Neurological: Awake, alert and oriented x 3. Sensation is intact, no neuropathy.  Musculoskeletal: No pain, normal strength and range of motion.  Thereasa Solo, MD

## 2017-11-09 DIAGNOSIS — R030 Elevated blood-pressure reading, without diagnosis of hypertension: Secondary | ICD-10-CM | POA: Diagnosis not present

## 2017-11-09 DIAGNOSIS — J209 Acute bronchitis, unspecified: Secondary | ICD-10-CM | POA: Diagnosis not present

## 2017-11-28 ENCOUNTER — Encounter: Payer: Self-pay | Admitting: Family Medicine

## 2017-11-28 ENCOUNTER — Other Ambulatory Visit: Payer: Self-pay

## 2017-11-28 ENCOUNTER — Ambulatory Visit: Payer: BLUE CROSS/BLUE SHIELD | Admitting: Family Medicine

## 2017-11-28 VITALS — BP 130/70 | HR 83 | Temp 98.4°F | Resp 17 | Ht 62.5 in | Wt 261.8 lb

## 2017-11-28 DIAGNOSIS — J069 Acute upper respiratory infection, unspecified: Secondary | ICD-10-CM

## 2017-11-28 DIAGNOSIS — J301 Allergic rhinitis due to pollen: Secondary | ICD-10-CM | POA: Diagnosis not present

## 2017-11-28 DIAGNOSIS — B9789 Other viral agents as the cause of diseases classified elsewhere: Secondary | ICD-10-CM

## 2017-11-28 MED ORDER — GUAIFENESIN ER 600 MG PO TB12: 600.0000 mg | ORAL_TABLET | Freq: Two times a day (BID) | ORAL | 3 refills | Status: DC

## 2017-11-28 MED ORDER — FLUTICASONE PROPIONATE 50 MCG/ACT NA SUSP: 2.0000 | Freq: Every day | NASAL | 6 refills | Status: DC

## 2017-11-28 MED ORDER — CETIRIZINE HCL 10 MG PO TABS: 10.0000 mg | ORAL_TABLET | Freq: Every day | ORAL | 11 refills | Status: AC

## 2017-11-28 NOTE — Progress Notes (Signed)
New Patient Office Visit  Subjective:  Patient ID: Ashley Flores, female    DOB: March 24, 1950  Age: 67 y.o. MRN: [id number]  CC:  Chief Complaint  Patient presents with  . New Patient (Initial Visit)    uri sxs x 6 weeks started with just a cold, got better and then cough back, given meds at MD, drainage from cough sand color.  Took mucinex on yesterday.  . Allergies    puffiness under eyes, coughed all night but no coughing this morning, per pt some wheezing this morning    HPI Ashley Flores presents for  Patient reports six week history of cough with light colored sputum She also has postnasal drip She denies fevers or chills She took mucinex She also has watery eyes with some edema under the eyes She feels better this morning     Past Medical History:  Diagnosis Date  . Cancer [facility])    endometrial cancer  . Obesity   . Ventral hernia     Past Surgical History:  Procedure Laterality Date  . laparoscopic surgery for fertility  40 yrs ago  . LYMPH NODE BIOPSY N/A 04/07/2015   Procedure:  SENTINEL LYMPH NODE BIOPSY;  Surgeon: Everitt Amber, MD;  Location: [facility];  Service: Gynecology;  Laterality: N/A;  . ROBOTIC ASSISTED TOTAL HYSTERECTOMY WITH BILATERAL SALPINGO OOPHERECTOMY Bilateral 04/07/2015   Procedure: XI ROBOTIC ASSISTED TOTAL LAPAROSCOPIC  HYSTERECTOMY WITH BILATERAL SALPINGO OOPHORECTOMY;  Surgeon: Everitt Amber, MD;  Location: [facility];  Service: Gynecology;  Laterality: Bilateral;  . VENTRAL HERNIA REPAIR N/A 04/07/2015   Procedure: LAPAROSCOPIC VENTRAL HERNIA  REPAIR WITH MESH;  Surgeon: Johnathan Hausen, MD;  Location: [facility];  Service: General;  Laterality: N/A;    Family History  Problem Relation Age of Onset  . Cancer Mother   . Hypertension Mother   . Stroke Mother     Social History   Socioeconomic History  . Marital status: Married    Spouse name: Not on file  . Number of children: Not on file  . Years of education: Not on file  . Highest education  level: Not on file  Occupational History  . Occupation: retired  Scientific laboratory technician  . Financial resource strain: Not hard at all  . Food insecurity:    Worry: Never true    Inability: Never true  . Transportation needs:    Medical: No    Non-medical: No  Tobacco Use  . Smoking status: Never Smoker  . Smokeless tobacco: Never Used  Substance and Sexual Activity  . Alcohol use: No  . Drug use: No  . Sexual activity: Yes    Birth control/protection: None, Post-menopausal  Lifestyle  . Physical activity:    Days per week: 5 days    Minutes per session: 30 min  . Stress: Only a little  Relationships  . Social connections:    Talks on phone: Three times a week    Gets together: Three times a week    Attends religious service: More than 4 times per year    Active member of club or organization: Yes    Attends meetings of clubs or organizations: More than 4 times per year    Relationship status: Married  . Intimate partner violence:    Fear of current or ex partner: No    Emotionally abused: No    Physically abused: No    Forced sexual activity: No  Other Topics Concern  . Not on file  Social History Narrative  . Not on file    ROS Review of Systems  Constitutional: Negative for diaphoresis, fatigue and fever.  HENT: Negative for ear discharge, ear pain and facial swelling.   Respiratory: Positive for cough. Negative for shortness of breath and wheezing.   Cardiovascular: Negative for chest pain and palpitations.  Genitourinary: Negative for dysuria and enuresis.  Neurological: Negative for light-headedness, numbness and headaches.    Objective:   Today's Vitals: BP 130/70 (BP Location: Right Arm, Patient Position: Sitting, Cuff Size: Large)   Pulse 83   Temp 98.4 F (36.9 C) (Oral)   Resp 17   Ht 5' 2.5" (1.588 m)   Wt 261 lb 12.8 oz (118.8 kg)   SpO2 93%   BMI 47.12 kg/m   Physical Exam   General: alert, oriented, in NAD Head: normocephalic, atraumatic, no  sinus tenderness Eyes: EOM intact, no scleral icterus or conjunctival injection Ears: TM clear bilaterally Nose: mucosa +erythematous, +edematous Throat: no pharyngeal exudate or erythema Lymph: no posterior auricular, submental or cervical lymph adenopathy Heart: normal rate, normal sinus rhythm, no murmurs Lungs: clear to auscultation bilaterally, no wheezing    Assessment & Plan:   Problem List Items Addressed This Visit      Respiratory   Viral URI with cough - Primary    Discussed viral etiology Discussed otc decongestant Advised flonase for congestion  Increase hydration Vitamin C and zinc lozenges suggested Return to clinic if symptoms worse       Non-seasonal allergic rhinitis due to pollen      Outpatient Encounter Medications as of 11/28/2017  Medication Sig  . cetirizine (ZYRTEC) 10 MG tablet Take 1 tablet (10 mg total) by mouth daily.  . fluticasone (FLONASE) 50 MCG/ACT nasal spray Place 2 sprays into both nostrils daily.  Marland Kitchen guaiFENesin (MUCINEX) 600 MG 12 hr tablet Take 1 tablet (600 mg total) by mouth 2 (two) times daily.  . [DISCONTINUED] Chlorphen-Phenyleph-ASA (ALKA-SELTZER PLUS COLD) 2-7.8-325 MG TBEF Take 1 tablet by mouth as needed.  . [DISCONTINUED] ibuprofen (ADVIL,MOTRIN) 200 MG tablet Take 400-600 mg by mouth every 6 (six) hours as needed for moderate pain. Reported on 04/24/2015   No facility-administered encounter medications on file as of 11/28/2017.     Follow-up: No follow-ups on file.   Forrest Moron, MD

## 2017-11-28 NOTE — Patient Instructions (Addendum)
FLONASE at bedtime Zyrtec in the morning once daily Mucinex for cough as instructed     If you have lab work done today you will be contacted with your lab results within the next 2 weeks.  If you have not heard from [place] then please contact us. The fastest way to get your results is to register for My Chart.   IF you received an x-ray today, you will receive an invoice from [facility]. Please contact [facility] at [phone] with questions or concerns regarding your invoice.   IF you received labwork today, you will receive an invoice from [place]. Please contact [facility] at [phone] with questions or concerns regarding your invoice.   Our billing staff will not be able to assist you with questions regarding bills from these companies.  You will be contacted with the lab results as soon as they are available. The fastest way to get your results is to activate your My Chart account. Instructions are located on the last page of this paperwork. If you have not heard from [place] regarding the results in 2 weeks, please contact this office.    Allergic Rhinitis, Adult Allergic rhinitis is an allergic reaction that affects the mucous membrane inside the nose. It causes sneezing, a runny or stuffy nose, and the feeling of mucus going down the back of the throat (postnasal drip). Allergic rhinitis can be mild to severe. There are two types of allergic rhinitis:  Seasonal. This type is also called hay fever. It happens only during certain seasons.  Perennial. This type can happen at any time of the year.  What are the causes? This condition happens when the body's defense system (immune system) responds to certain harmless substances called allergens as though they were germs.  Seasonal allergic rhinitis is triggered by pollen, which can come from grasses, trees, and weeds. Perennial allergic rhinitis may be caused by:  House dust mites.  Pet dander.  Mold  spores.  What are the signs or symptoms? Symptoms of this condition include:  Sneezing.  Runny or stuffy nose (nasal congestion).  Postnasal drip.  Itchy nose.  Tearing of the eyes.  Trouble sleeping.  Daytime sleepiness.  How is this diagnosed? This condition may be diagnosed based on:  Your medical history.  A physical exam.  Tests to check for related conditions, such as: ? Asthma. ? Pink eye. ? Ear infection. ? Upper respiratory infection.  Tests to find out which allergens trigger your symptoms. These may include skin or blood tests.  How is this treated? There is no cure for this condition, but treatment can help control symptoms. Treatment may include:  Taking medicines that block allergy symptoms, such as antihistamines. Medicine may be given as a shot, nasal spray, or pill.  Avoiding the allergen.  Desensitization. This treatment involves getting ongoing shots until your body becomes less sensitive to the allergen. This treatment may be done if other treatments do not help.  If taking medicine and avoiding the allergen does not work, new, stronger medicines may be prescribed.  Follow these instructions at home:  Find out what you are allergic to. Common allergens include smoke, dust, and pollen.  Avoid the things you are allergic to. These are some things you can do to help avoid allergens: ? Replace carpet with wood, tile, or vinyl flooring. Carpet can trap dander and dust. ? Do not smoke. Do not allow smoking in your home. ? Change your heating and air conditioning filter at least  once a month. ? During allergy season:  Keep windows closed as much as possible.  Plan outdoor activities when pollen counts are lowest. This is usually during the evening hours.  When coming indoors, change clothing and shower before sitting on furniture or bedding.  Take over-the-counter and prescription medicines only as told by your health care provider.  Keep  all follow-up visits as told by your health care provider. This is important. Contact a health care provider if:  You have a fever.  You develop a persistent cough.  You make whistling sounds when you breathe (you wheeze).  Your symptoms interfere with your normal daily activities. Get help right away if:  You have shortness of breath. Summary  This condition can be managed by taking medicines as directed and avoiding allergens.  Contact your health care provider if you develop a persistent cough or fever.  During allergy season, keep windows closed as much as possible. This information is not intended to replace advice given to you by your health care provider. Make sure you discuss any questions you have with your health care provider. Document Released: 09/14/2000 Document Revised: 01/28/2016 Document Reviewed: 01/28/2016 Elsevier Interactive Patient Education  [employer].

## 2017-11-28 NOTE — Assessment & Plan Note (Signed)
Discussed viral etiology Discussed otc decongestant Advised flonase for congestion  Increase hydration Vitamin C and zinc lozenges suggested Return to clinic if symptoms worse  

## 2017-12-01 ENCOUNTER — Ambulatory Visit: Payer: Self-pay

## 2017-12-01 DIAGNOSIS — J40 Bronchitis, not specified as acute or chronic: Secondary | ICD-10-CM | POA: Diagnosis not present

## 2017-12-01 DIAGNOSIS — Z90710 Acquired absence of both cervix and uterus: Secondary | ICD-10-CM | POA: Diagnosis not present

## 2017-12-01 DIAGNOSIS — R05 Cough: Secondary | ICD-10-CM | POA: Diagnosis not present

## 2017-12-01 DIAGNOSIS — R062 Wheezing: Secondary | ICD-10-CM | POA: Diagnosis not present

## 2017-12-01 DIAGNOSIS — Z888 Allergy status to other drugs, medicaments and biological substances status: Secondary | ICD-10-CM | POA: Diagnosis not present

## 2017-12-01 DIAGNOSIS — R0602 Shortness of breath: Secondary | ICD-10-CM | POA: Diagnosis not present

## 2017-12-01 DIAGNOSIS — J209 Acute bronchitis, unspecified: Secondary | ICD-10-CM | POA: Diagnosis not present

## 2017-12-01 DIAGNOSIS — Z8542 Personal history of malignant neoplasm of other parts of uterus: Secondary | ICD-10-CM | POA: Diagnosis not present

## 2017-12-01 NOTE — Telephone Encounter (Signed)
Patient called in with c/o "wheezing, SOB." She says "this has been going on for several weeks, but is not getting better. I saw Dr. Nolon Rod on Tuesday and the medicine she gave me is not working. I am wheezing more, which when I saw her, I wasn't wheezing. But, the wheezing comes and goes for the past few weeks, it just wasn't present when I saw her." I asked about other symptoms, she says "just a deep cough and that's all. I just can't get my breath." Patient has audible wheezing as I'm talking to her and speaking in shorter phrases. According to protocol, go to ED, care advice given, patient verbalized understanding. She says she will ask her husband to take her.   Reason for Disposition . [1] MODERATE difficulty breathing (e.g., speaks in phrases, SOB even at rest, pulse 100-120) AND [2] NEW-onset or WORSE than normal  Answer Assessment - Initial Assessment Questions 1. RESPIRATORY STATUS: "Describe your breathing?" (e.g., wheezing, shortness of breath, unable to speak, severe coughing)      Shortness of breath, wheezing 2. ONSET: "When did this breathing problem begin?"      Weeks, but the wheezing is worse, can't get breath 3. PATTERN "Does the difficult breathing come and go, or has it been constant since it started?"      Come and go wheezing 4. SEVERITY: "How bad is your breathing?" (e.g., mild, moderate, severe)    - MILD: No SOB at rest, mild SOB with walking, speaks normally in sentences, can lay down, no retractions, pulse < 100.    - MODERATE: SOB at rest, SOB with minimal exertion and prefers to sit, cannot lie down flat, speaks in phrases, mild retractions, audible wheezing, pulse 100-120.    - SEVERE: Very SOB at rest, speaks in single words, struggling to breathe, sitting hunched forward, retractions, pulse > 120      Moderate 5. RECURRENT SYMPTOM: "Have you had difficulty breathing before?" If so, ask: "When was the last time?" and "What happened that time?"      This is the  first time 6. CARDIAC HISTORY: "Do you have any history of heart disease?" (e.g., heart attack, angina, bypass surgery, angioplasty)      No 7. LUNG HISTORY: "Do you have any history of lung disease?"  (e.g., pulmonary embolus, asthma, emphysema)     No 8. CAUSE: "What do you think is causing the breathing problem?"       I don't really know 9. OTHER SYMPTOMS: "Do you have any other symptoms? (e.g., dizziness, runny nose, cough, chest pain, fever)     Cough 10. PREGNANCY: "Is there any chance you are pregnant?" "When was your last menstrual period?"       No 11. TRAVEL: "Have you traveled out of the country in the last month?" (e.g., travel history, exposures)       No  Protocols used: BREATHING DIFFICULTY-A-AH

## 2017-12-04 ENCOUNTER — Telehealth: Payer: Self-pay | Admitting: Family Medicine

## 2017-12-04 NOTE — Telephone Encounter (Signed)
Called pt to let her know we scheduled appt for ER follow up .

## 2017-12-04 NOTE — Telephone Encounter (Signed)
Copied from [place] [id number]. Topic: Appointment Scheduling - Scheduling Inquiry for Clinic >> Dec 04, 2017 10:13 AM Blase Mess A wrote:  Reason for CRM: Patient is calling for an ED following up Patient was in the ED for Walking Pneumonia.  Soonest scheduled appt is 12/29/17 [phone]

## 2017-12-07 NOTE — Progress Notes (Signed)
Established Patient Office Visit  Subjective:  Patient ID: Ashley Flores, female    DOB: [birth year]  Age: 67 y.o. MRN: [id number]  CC:  Chief Complaint  Patient presents with  . ER follow up    HPI [name] presents for   Pt reports that she was treated by [facility] ER in [place] and was prescribed doxycycline 12/01/2017 She reports that she coughs more at night She states that she has some fatigue She states that she finished her antibiotic this morning She states that she was using mucinex  She is using zyrtec and tessalon perles      Past Medical History:  Diagnosis Date  . Cancer [facility])    endometrial cancer  . Obesity   . Ventral hernia     Past Surgical History:  Procedure Laterality Date  . laparoscopic surgery for fertility  40 yrs ago  . LYMPH NODE BIOPSY N/A 04/07/2015   Procedure:  SENTINEL LYMPH NODE BIOPSY;  Surgeon: Everitt Amber, MD;  Location: [facility];  Service: Gynecology;  Laterality: N/A;  . ROBOTIC ASSISTED TOTAL HYSTERECTOMY WITH BILATERAL SALPINGO OOPHERECTOMY Bilateral 04/07/2015   Procedure: XI ROBOTIC ASSISTED TOTAL LAPAROSCOPIC  HYSTERECTOMY WITH BILATERAL SALPINGO OOPHORECTOMY;  Surgeon: Everitt Amber, MD;  Location: [facility];  Service: Gynecology;  Laterality: Bilateral;  . VENTRAL HERNIA REPAIR N/A 04/07/2015   Procedure: LAPAROSCOPIC VENTRAL HERNIA  REPAIR WITH MESH;  Surgeon: Johnathan Hausen, MD;  Location: [facility];  Service: General;  Laterality: N/A;    Family History  Problem Relation Age of Onset  . Cancer Mother   . Hypertension Mother   . Stroke Mother     Social History   Socioeconomic History  . Marital status: Married    Spouse name: Not on file  . Number of children: Not on file  . Years of education: Not on file  . Highest education level: Not on file  Occupational History  . Occupation: retired  Scientific laboratory technician  . Financial resource strain: Not hard at all  . Food insecurity:    Worry: Never true    Inability: Never  true  . Transportation needs:    Medical: No    Non-medical: No  Tobacco Use  . Smoking status: Never Smoker  . Smokeless tobacco: Never Used  Substance and Sexual Activity  . Alcohol use: No  . Drug use: No  . Sexual activity: Yes    Birth control/protection: None, Post-menopausal  Lifestyle  . Physical activity:    Days per week: 5 days    Minutes per session: 30 min  . Stress: Only a little  Relationships  . Social connections:    Talks on phone: Three times a week    Gets together: Three times a week    Attends religious service: More than 4 times per year    Active member of club or organization: Yes    Attends meetings of clubs or organizations: More than 4 times per year    Relationship status: Married  . Intimate partner violence:    Fear of current or ex partner: No    Emotionally abused: No    Physically abused: No    Forced sexual activity: No  Other Topics Concern  . Not on file  Social History Narrative  . Not on file    Outpatient Medications Prior to Visit  Medication Sig Dispense Refill  . cetirizine (ZYRTEC) 10 MG tablet Take 1 tablet (10 mg total) by mouth daily. 30 tablet  11  . doxycycline (DORYX) 100 MG EC tablet Take 100 mg by mouth daily.    . fluticasone (FLONASE) 50 MCG/ACT nasal spray Place 2 sprays into both nostrils daily. 16 g 6  . benzonatate (TESSALON) 100 MG capsule Take by mouth 3 (three) times daily as needed for cough.    Marland Kitchen guaiFENesin (MUCINEX) 600 MG 12 hr tablet Take 1 tablet (600 mg total) by mouth 2 (two) times daily. 30 tablet 3   No facility-administered medications prior to visit.     Allergies  Allergen Reactions  . Pyridium [Phenazopyridine Hcl] Other (See Comments)    Made pt feel out of it, room spinning, hallucinations.     ROS Review of Systems    Objective:    Physical Exam  BP (!) 153/89 (BP Location: Right Arm, Patient Position: Sitting, Cuff Size: Large)   Pulse 74   Temp 98.1 F (36.7 C) (Oral)    Resp 17   Ht 5' 2.5" (1.588 m)   Wt 256 lb 9.6 oz (116.4 kg)   SpO2 92%   BMI 46.18 kg/m  Wt Readings from Last 3 Encounters:  12/08/17 256 lb 9.6 oz (116.4 kg)  11/28/17 261 lb 12.8 oz (118.8 kg)  10/16/17 261 lb 1.6 oz (118.4 kg)    General: alert, oriented, in NAD Head: normocephalic, atraumatic, no sinus tenderness Eyes: EOM intact, no scleral icterus or conjunctival injection Ears: TM clear bilaterally Nose: mucosa nonerythematous, nonedematous Throat: no pharyngeal exudate or erythema Lymph: no posterior auricular, submental or cervical lymph adenopathy Heart: normal rate, normal sinus rhythm, no murmurs Lungs: clear to auscultation bilaterally, no wheezing   Acute Interface, Incoming Rad Results - 12/01/2017 4:00 PM EST  TECHNIQUE: XR CHEST PA AND LATERAL  INDICATION: Cough  COMPARISON: None  FINDINGS: Normal cardiac and mediastinal contours. No pleural effusion or pneumothorax. There is mild diffuse bilateral interstitial thickening. No confluent consolidation. Mild degenerative changes in the thoracic spine.   IMPRESSION: Bilateral interstitial thickening which could reflect edema or atypical infection.  Electronically Signed by: Sol Passer    Health Maintenance Due  Topic Date Due  . Hepatitis C Screening  1950-06-04  . HIV Screening  06/28/1967  . MAMMOGRAM  06/28/2002  . COLONOSCOPY  06/28/2002  . DEXA SCAN  06/27/2017  . PNA vac Low Risk Adult (1 of 2 - PCV13) 06/27/2017    There are no preventive care reminders to display for this patient.  No results found for: TSH Lab Results  Component Value Date   WBC 13.4 (H) 04/10/2015   HGB 9.7 (L) 04/10/2015   HCT 32.0 (L) 04/10/2015   MCV 71.2 (L) 04/10/2015   PLT 341 04/10/2015   Lab Results  Component Value Date   NA 138 04/08/2015   K 4.9 04/08/2015   CO2 23 04/08/2015   GLUCOSE 216 (H) 04/08/2015   BUN 17 04/08/2015   CREATININE 0.94 04/08/2015   BILITOT 0.4 04/01/2015    ALKPHOS 74 04/01/2015   AST 18 04/01/2015   ALT 16 04/01/2015   PROT 8.0 04/01/2015   ALBUMIN 4.2 04/01/2015   CALCIUM 9.4 04/08/2015   ANIONGAP 12 04/08/2015   No results found for: CHOL No results found for: HDL No results found for: LDLCALC No results found for: TRIG No results found for: CHOLHDL No results found for: HGBA1C    Assessment & Plan:   Problem List Items Addressed This Visit    None    Visit Diagnoses    Acute  bronchitis and bronchiolitis    -  Primary  Advised to use albuterol for cough symptoms Continue supportive care Repeat cxr in 4-6 weeks    Relevant Medications   albuterol (PROVENTIL HFA;VENTOLIN HFA) 108 (90 Base) MCG/ACT inhaler   benzonatate (TESSALON) 100 MG capsule   guaiFENesin (MUCINEX) 600 MG 12 hr tablet   Bronchospasm, acute          Meds ordered this encounter  Medications  . albuterol (PROVENTIL HFA;VENTOLIN HFA) 108 (90 Base) MCG/ACT inhaler    Sig: Inhale 2 puffs into the lungs every 6 (six) hours as needed for wheezing or shortness of breath.    Dispense:  1 Inhaler    Refill:  0  . benzonatate (TESSALON) 100 MG capsule    Sig: Take 1 capsule (100 mg total) by mouth 3 (three) times daily as needed for cough.    Dispense:  20 capsule    Refill:  2  . guaiFENesin (MUCINEX) 600 MG 12 hr tablet    Sig: Take 1 tablet (600 mg total) by mouth 2 (two) times daily.    Dispense:  30 tablet    Refill:  1    Follow-up: Return in about 4 weeks (around 01/05/2018) for bronchospasm and bronchitis and repeat cxr.    Forrest Moron, MD

## 2017-12-08 ENCOUNTER — Ambulatory Visit: Payer: BLUE CROSS/BLUE SHIELD | Admitting: Family Medicine

## 2017-12-08 ENCOUNTER — Encounter: Payer: Self-pay | Admitting: Family Medicine

## 2017-12-08 ENCOUNTER — Other Ambulatory Visit: Payer: Self-pay

## 2017-12-08 VITALS — BP 153/89 | HR 74 | Temp 98.1°F | Resp 17 | Ht 62.5 in | Wt 256.6 lb

## 2017-12-08 DIAGNOSIS — J9801 Acute bronchospasm: Secondary | ICD-10-CM | POA: Diagnosis not present

## 2017-12-08 DIAGNOSIS — J219 Acute bronchiolitis, unspecified: Secondary | ICD-10-CM | POA: Diagnosis not present

## 2017-12-08 DIAGNOSIS — J209 Acute bronchitis, unspecified: Secondary | ICD-10-CM | POA: Diagnosis not present

## 2017-12-08 MED ORDER — GUAIFENESIN ER 600 MG PO TB12: 600.0000 mg | ORAL_TABLET | Freq: Two times a day (BID) | ORAL | 1 refills | Status: DC

## 2017-12-08 MED ORDER — ALBUTEROL SULFATE HFA 108 (90 BASE) MCG/ACT IN AERS: 2.0000 | INHALATION_SPRAY | Freq: Four times a day (QID) | RESPIRATORY_TRACT | 0 refills | Status: DC | PRN

## 2017-12-08 MED ORDER — BENZONATATE 100 MG PO CAPS: 100.0000 mg | ORAL_CAPSULE | Freq: Three times a day (TID) | ORAL | 2 refills | Status: DC | PRN

## 2017-12-08 NOTE — Patient Instructions (Addendum)
Repeat CXR in one month to check on bilateral thickening on CXR  ALBUTEROL  Use albuterol at bedtime and as needed until cough improves    If you have lab work done today you will be contacted with your lab results within the next 2 weeks.  If you have not heard from [place] then please contact us. The fastest way to get your results is to register for My Chart.   IF you received an x-ray today, you will receive an invoice from [facility]. Please contact [facility] at [phone] with questions or concerns regarding your invoice.   IF you received labwork today, you will receive an invoice from [place]. Please contact [facility] at [phone] with questions or concerns regarding your invoice.   Our billing staff will not be able to assist you with questions regarding bills from these companies.  You will be contacted with the lab results as soon as they are available. The fastest way to get your results is to activate your My Chart account. Instructions are located on the last page of this paperwork. If you have not heard from [place] regarding the results in 2 weeks, please contact this office.      Bronchospasm, Adult Bronchospasm is a tightening of the airways going into the lungs. During an episode, it may be harder to breathe. You may cough, and you may make a whistling sound when you breathe (wheeze). This condition often affects people with asthma. What are the causes? This condition is caused by swelling and irritation in the airways. It can be triggered by:  An infection (common).  Seasonal allergies.  An allergic reaction.  Exercise.  Irritants. These include pollution, cigarette smoke, strong odors, aerosol sprays, and paint fumes.  Weather changes. Winds increase molds and pollens in the air. Cold air may cause swelling.  Stress and emotional upset.  What are the signs or symptoms? Symptoms of this condition include:  Wheezing. If the episode was  triggered by an allergy, wheezing may start right away or hours later.  Nighttime coughing.  Frequent or severe coughing with a simple cold.  Chest tightness.  Shortness of breath.  Decreased ability to exercise.  How is this diagnosed? This condition is usually diagnosed with a review of your medical history and a physical exam. Tests, such as lung function tests, are sometimes done to look for other conditions. The need for a chest X-ray depends on where the wheezing occurs and whether it is the first time you have wheezed. How is this treated? This condition may be treated with:  Inhaled medicines. These open up the airways and help you breathe. They can be taken with an inhaler or a nebulizer device.  Corticosteroid medicines. These may be given for severe bronchospasm, usually when it is associated with asthma.  Avoiding triggers, such as irritants, infection, or allergies.  Follow these instructions at home: Medicines  Take over-the-counter and prescription medicines only as told by your health care provider.  If you need to use an inhaler or nebulizer to take your medicine, ask your health care provider to explain how to use it correctly. If you were given a spacer, always use it with your inhaler. Lifestyle  Reduce the number of triggers in your home. To do this: ? Change your heating and air conditioning filter at least once a month. ? Limit your use of fireplaces and wood stoves. ? Do not smoke. Do not allow smoking in your home. ? Avoid using perfumes and  fragrances. ? Get rid of pests, such as roaches and mice, and their droppings. ? Remove any mold from your home. ? Keep your house clean and dust free. Use unscented cleaning products. ? Replace carpet with wood, tile, or vinyl flooring. Carpet can trap dander and dust. ? Use allergy-proof pillows, mattress covers, and box spring covers. ? Wash bed sheets and blankets every week in hot water. Dry them in a  dryer. ? Use blankets that are made of polyester or cotton. ? Wash your hands often. ? Do not allow pets in your bedroom.  Avoid breathing in cold air when you exercise. General instructions  Have a plan for seeking medical care. Know when to call your health care provider and local emergency services, and where to get emergency care.  Stay up to date on your immunizations.  When you have an episode of bronchospasm, stay calm. Try to relax and breathe more slowly.  If you have asthma, make sure you have an asthma action plan.  Keep all follow-up visits as told by your health care provider. This is important. Contact a health care provider if:  You have muscle aches.  You have chest pain.  The mucus that you cough up (sputum) changes from clear or white to yellow, green, gray, or bloody.  You have a fever.  Your sputum gets thicker. Get help right away if:  Your wheezing and coughing get worse, even after you take your prescribed medicines.  It gets even harder to breathe.  You develop severe chest pain. Summary  Bronchospasm is a tightening of the airways going into the lungs.  During an episode of bronchospasm, you may have a harder time breathing. You may cough and make a whistling sound when you breathe (wheeze).  Avoid exposure to triggers such as smoke, dust, mold, animal dander, and fragrances.  When you have an episode of bronchospasm, stay calm. Try to relax and breathe more slowly. This information is not intended to replace advice given to you by your health care provider. Make sure you discuss any questions you have with your health care provider. Document Released: 12/23/2002 Document Revised: 12/17/2015 Document Reviewed: 12/17/2015 Elsevier Interactive Patient Education  2017 [employer].

## 2017-12-17 IMAGING — CT CT ABD-PELV W/ CM
1 of 3 series · 13 of 32 positions shown, 18 images · IV contrast (OMNIPAQUE)
Comparison: No priors.

CLINICAL DATA: Initial evaluation of a 64-year-old female with
history of endometrial adenocarcinoma.

EXAM:
CT CHEST, ABDOMEN, AND PELVIS WITH CONTRAST
TECHNIQUE: Multidetector CT imaging of the chest, abdomen and pelvis was
performed following the standard protocol during bolus
administration of intravenous contrast.
CONTRAST:  100mL OMNIPAQUE IOHEXOL 300 MG/ML  SOLN

[Series 2: cap w · axial · 0.95mm/px · z∈[+504,+1064]mm · 13 of 126 slices shown, 18 images]
[im 7/126  soft-tissue]
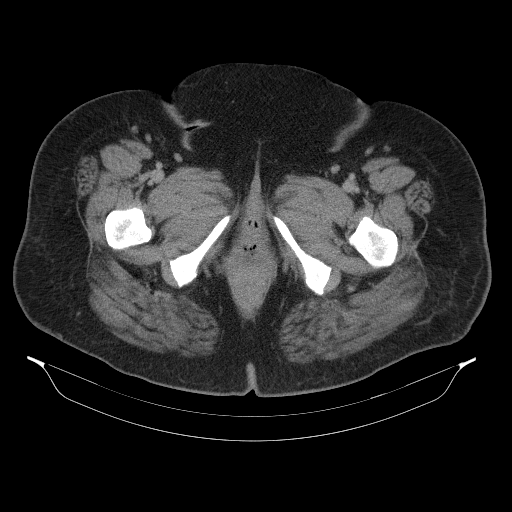
[im 7/126  bone]
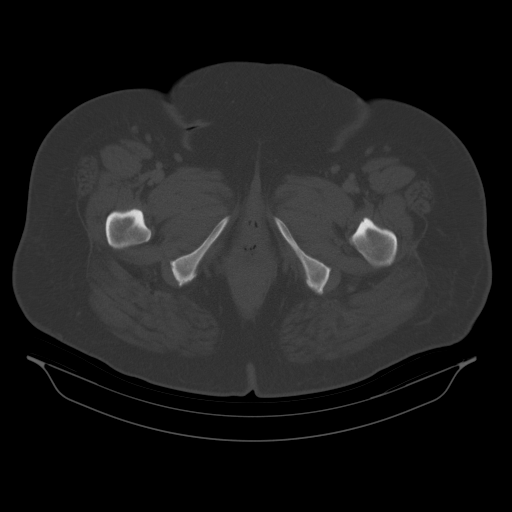
[im 21/126  soft-tissue]
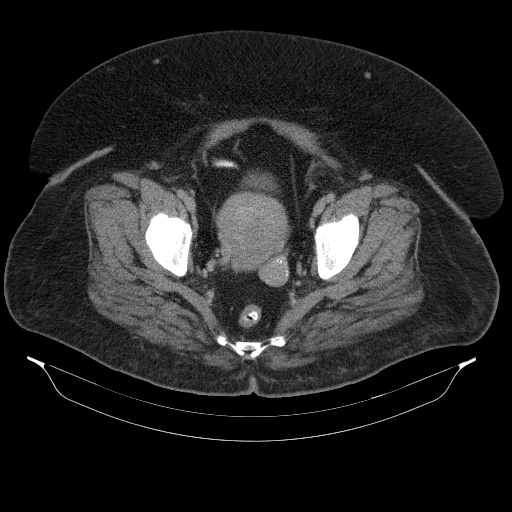
[im 28/126  soft-tissue]
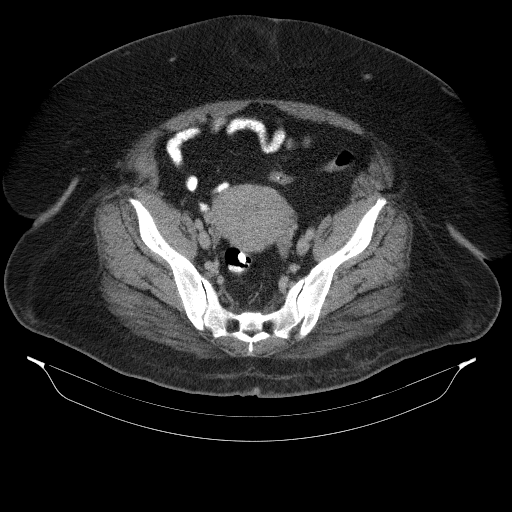
[im 35/126  soft-tissue]
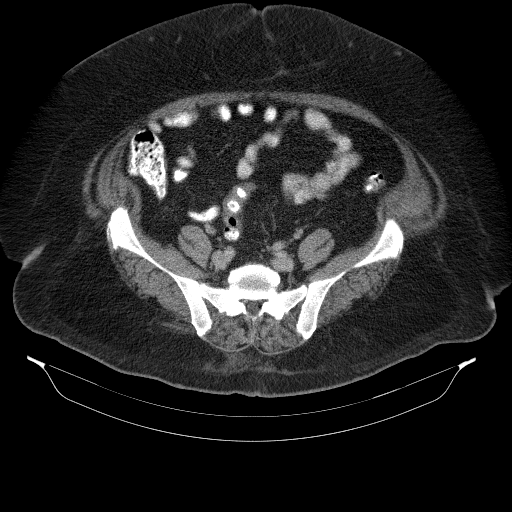
[im 49/126  soft-tissue]
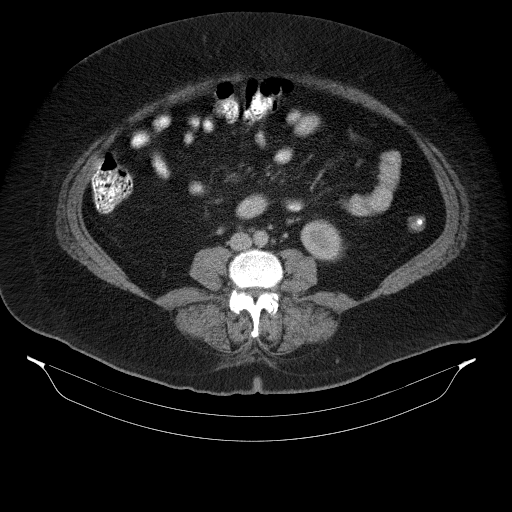
[im 56/126  soft-tissue]
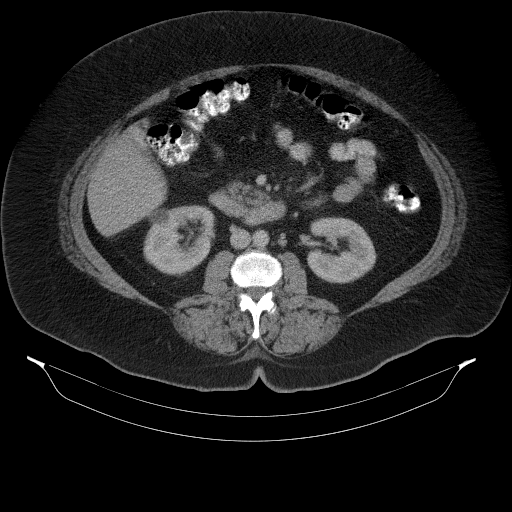
[im 70/126  soft-tissue]
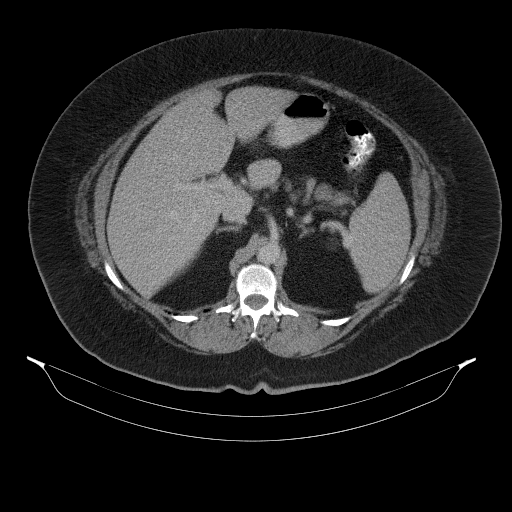
[im 77/126  soft-tissue]
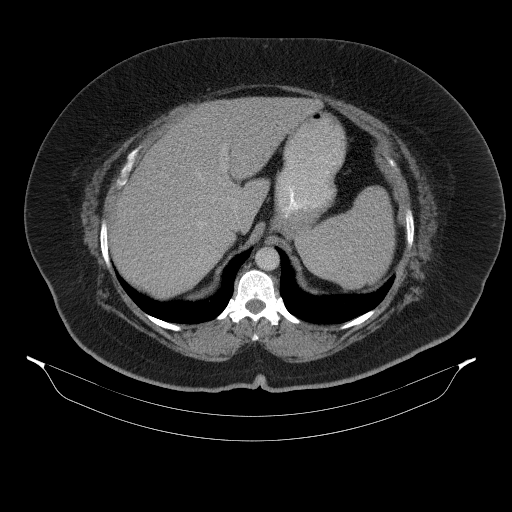
[im 91/126  soft-tissue]
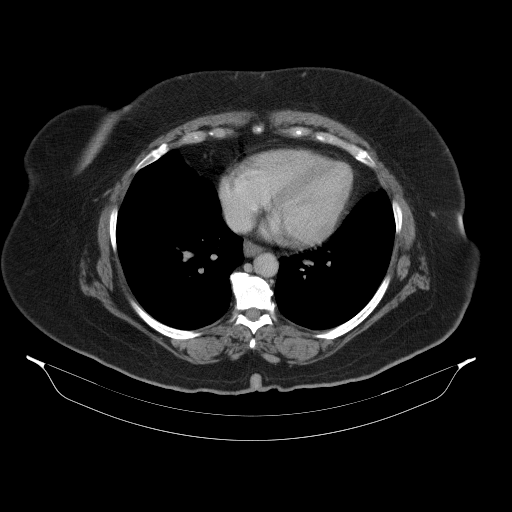
[im 91/126  bone]
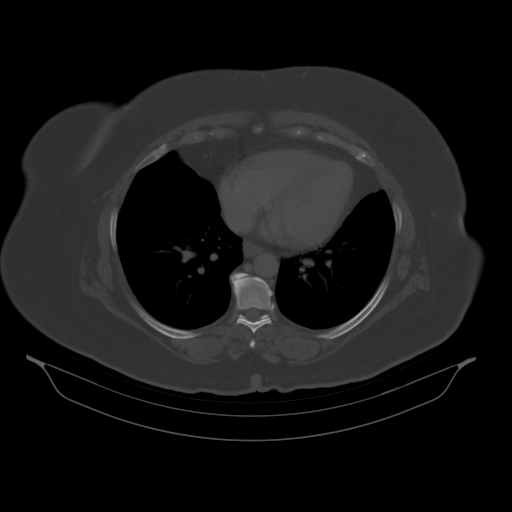
[im 98/126  soft-tissue]
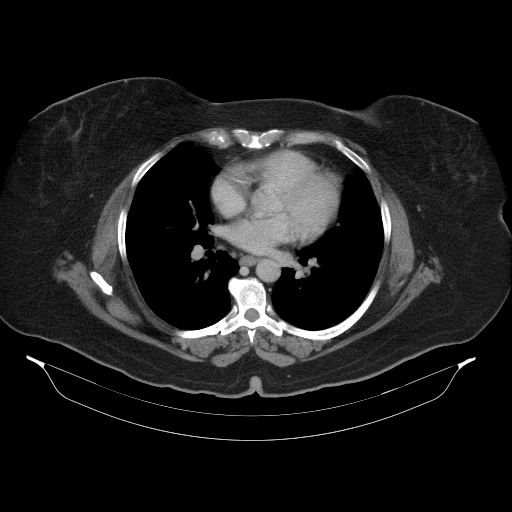
[im 98/126  lung]
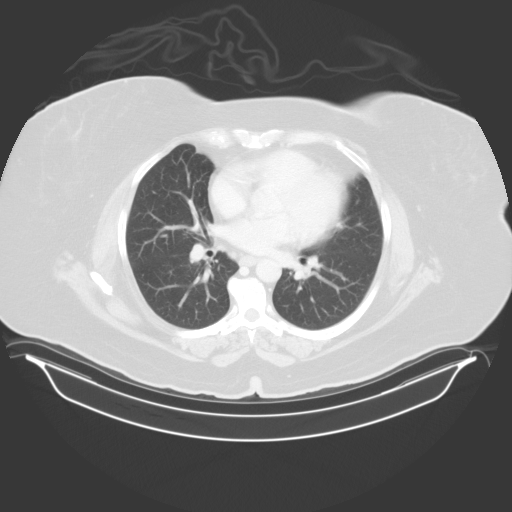
[im 105/126  soft-tissue]
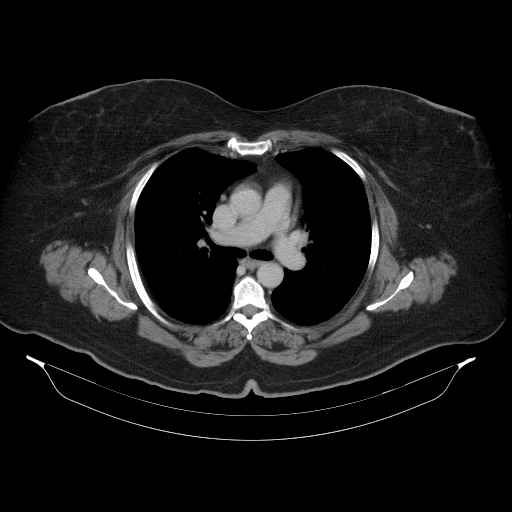
[im 105/126  lung]
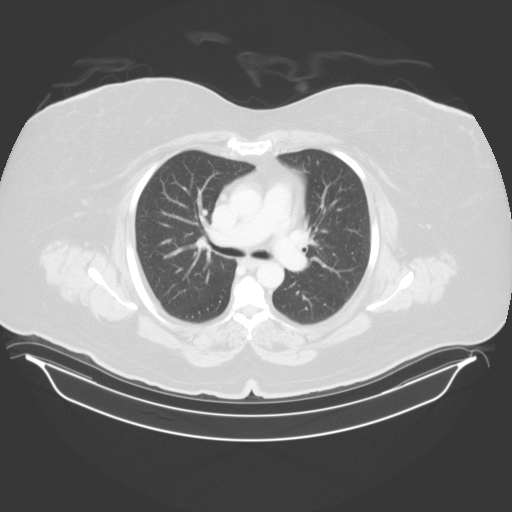
[im 112/126  lung]
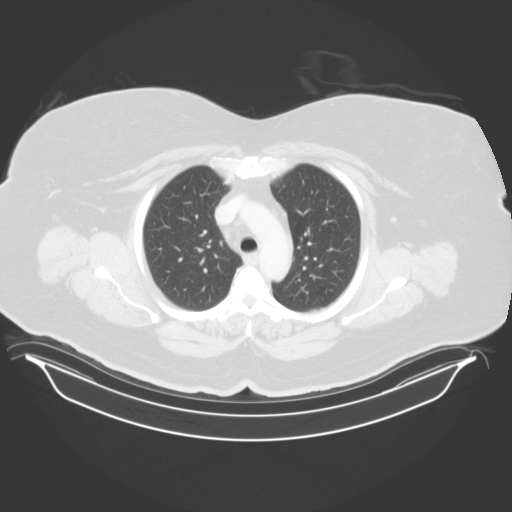
[im 119/126  soft-tissue]
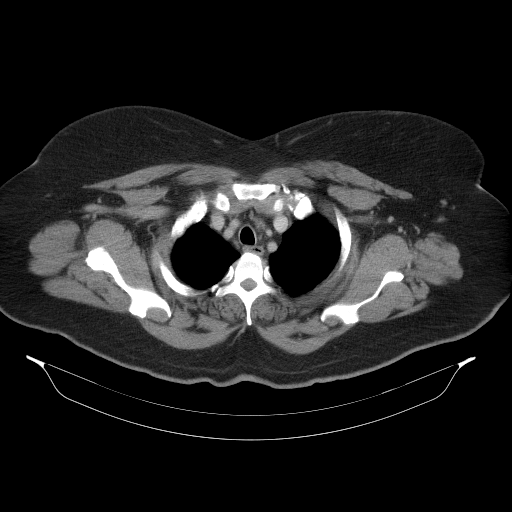
[im 119/126  lung]
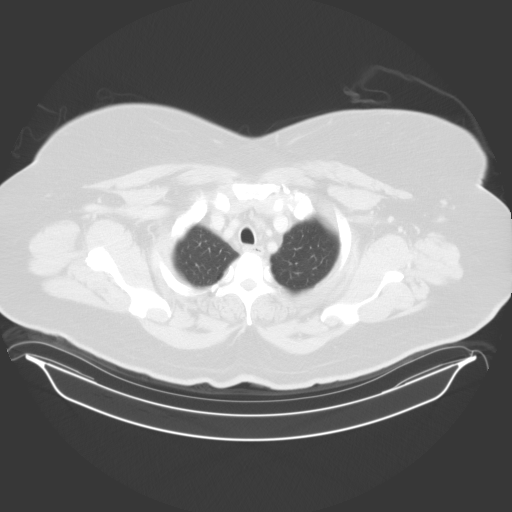

[13 of 32 positions shown; findings below may reference images not displayed]

FINDINGS: CT CHEST FINDINGS

Mediastinum/Lymph Nodes: Heart size is normal. There is no
significant pericardial fluid, thickening or pericardial
calcification. No pathologically enlarged mediastinal or hilar lymph
nodes. Small hiatal hernia. No axillary lymphadenopathy.

Lungs/Pleura: 3 mm left lower lobe pulmonary nodule (image 45 of
series 4). No other larger more suspicious appearing pulmonary
nodules or masses are noted. No acute consolidative airspace
disease. No pleural effusions.

Musculoskeletal/Soft Tissues: No suspicious cystic or solid hepatic
lesions. No intra or extrahepatic biliary ductal dilatation.
Gallbladder is normal in appearance.

CT ABDOMEN AND PELVIS FINDINGS

Hepatobiliary: No suspicious cystic or solid hepatic lesions. No
intra or extrahepatic biliary ductal dilatation. Gallbladder is
normal in appearance.

Pancreas: No pancreatic mass. No pancreatic ductal dilatation. No
pancreatic or peripancreatic fluid or inflammatory changes.

Spleen: Unremarkable.

Adrenals/Urinary Tract: Bilateral adrenal glands and the left kidney
are normal in appearance. Exophytic 1.9 cm low-attenuation lesion in
the interpolar region of the right kidney is compatible with a small
simple cyst. No hydroureteronephrosis. Urinary bladder is normal in
appearance.

Stomach/Bowel: The appearance of the stomach is normal. No
pathologic dilatation of small bowel or colon. Normal appendix.

Vascular/Lymphatic: Mild atherosclerosis throughout the abdominal
and pelvic vasculature, without evidence of aneurysm or dissection.
No lymphadenopathy noted in the abdomen or pelvis.

Reproductive: Although poorly depicted on today's CT examination,
the endometrium appears thickened, estimated to measure at least 33
mm in the fundal region. Ovaries are unremarkable in appearance.

Other: No significant volume of ascites. No pneumoperitoneum. Large
umbilical hernia containing only omental fat.

Musculoskeletal: There are no aggressive appearing lytic or blastic
lesions noted in the visualized portions of the skeleton.
IMPRESSION: 1. Endometrial thickening redemonstrated, estimated to measure at
least 33 mm in thickness in the fundal region. No definite evidence
of metastatic disease in the chest, abdomen or pelvis.
2. There is a tiny 3 mm pulmonary nodule in the periphery of the
left lower lobe (image 45 of series 4), which is highly nonspecific
and statistically likely benign. However, attention on followup
studies is recommended to ensure the stability of this finding.
3. Large umbilical hernia containing only omental fat. No associated
bowel incarceration or obstruction at this time.
4. Normal appendix.
5. Atherosclerosis.

## 2017-12-24 ENCOUNTER — Other Ambulatory Visit: Payer: Self-pay | Admitting: Family Medicine

## 2017-12-24 DIAGNOSIS — J209 Acute bronchitis, unspecified: Secondary | ICD-10-CM

## 2017-12-24 DIAGNOSIS — J219 Acute bronchiolitis, unspecified: Principal | ICD-10-CM

## 2017-12-25 ENCOUNTER — Other Ambulatory Visit: Payer: Self-pay | Admitting: *Deleted

## 2017-12-25 DIAGNOSIS — J219 Acute bronchiolitis, unspecified: Principal | ICD-10-CM

## 2017-12-25 DIAGNOSIS — J209 Acute bronchitis, unspecified: Secondary | ICD-10-CM

## 2017-12-25 MED ORDER — ALBUTEROL SULFATE HFA 108 (90 BASE) MCG/ACT IN AERS: 2.0000 | INHALATION_SPRAY | Freq: Four times a day (QID) | RESPIRATORY_TRACT | 0 refills | Status: DC | PRN

## 2017-12-25 NOTE — Telephone Encounter (Signed)
Copied from [place] [id number]. Topic: Quick Communication - See Telephone Encounter >> Dec 25, 2017 10:40 AM Marja Kays F wrote: Pt is checking on status of her albuteral refill request she is going out of town and is needing to get this today  because she is going out of town   Best number is [phone]

## 2017-12-25 NOTE — Telephone Encounter (Signed)
Requested medication (s) are due for refill today: no  Requested medication (s) are on the active medication list: yes  Last refill:  12/08/17  Future visit scheduled: yes  Notes to clinic:  Pulmonology: Beta Agonists failed.  Requested Prescriptions  Pending Prescriptions Disp Refills   albuterol (PROVENTIL HFA;VENTOLIN HFA) 108 (90 Base) MCG/ACT inhaler [Pharmacy Med Name: ALBUTEROL HFA INH (200 PUFFS) 6.7GM] 6.7 g     Sig: INHALE 2 PUFFS INTO THE LUNGS EVERY 6 HOURS AS NEEDED FOR WHEEZING OR SHORTNESS OF BREATH     Pulmonology:  Beta Agonists Failed - 12/24/2017 11:00 AM      Failed - One inhaler should last at least one month. If the patient is requesting refills earlier, contact the patient to check for uncontrolled symptoms.      Passed - Valid encounter within last 12 months    Recent Outpatient Visits          2 weeks ago Acute bronchitis and bronchiolitis   [facility] [place], MD   3 weeks ago Viral URI with cough   [facility] [place], MD      Future Appointments            In 3 weeks Forrest Moron, MD [facility] [place], [facility]

## 2017-12-25 NOTE — Telephone Encounter (Signed)
Left message Please find out how much patient is using her inhaler see what her symptoms are.    One inhaler should last at least one month. If the patient is requesting refills earlier, contact the patient to check for uncontrolled symptoms.

## 2017-12-30 ENCOUNTER — Encounter: Payer: Self-pay | Admitting: Family Medicine

## 2017-12-30 ENCOUNTER — Other Ambulatory Visit: Payer: Self-pay

## 2017-12-30 ENCOUNTER — Ambulatory Visit: Payer: BLUE CROSS/BLUE SHIELD | Admitting: Family Medicine

## 2017-12-30 VITALS — BP 138/78 | HR 80 | Temp 98.5°F | Resp 16 | Ht 61.81 in | Wt 251.0 lb

## 2017-12-30 DIAGNOSIS — R9389 Abnormal findings on diagnostic imaging of other specified body structures: Secondary | ICD-10-CM | POA: Diagnosis not present

## 2017-12-30 DIAGNOSIS — R059 Cough, unspecified: Secondary | ICD-10-CM

## 2017-12-30 DIAGNOSIS — R05 Cough: Secondary | ICD-10-CM

## 2017-12-30 LAB — POCT CBC
Granulocyte percent: 79.5 %G (ref 37–80)
HCT, POC: 44.9 % — AB (ref 29–41)
Hemoglobin: 15.3 g/dL — AB (ref 11–14.6)
Lymph, poc: 1.2 (ref 0.6–3.4)
MCH, POC: 28.6 pg (ref 27–31.2)
MCHC: 34 g/dL (ref 31.8–35.4)
MCV: 84.3 fL (ref 76–111)
MID (CBC): 0.5 (ref 0–0.9)
MPV: 8.1 fL (ref 0–99.8)
PLATELET COUNT, POC: 200 10*3/uL (ref 142–424)
POC Granulocyte: 6.4 (ref 2–6.9)
POC LYMPH %: 14.6 % (ref 10–50)
POC MID %: 5.9 % (ref 0–12)
RBC: 5.33 M/uL (ref 4.04–5.48)
RDW, POC: 15.1 %
WBC: 8 10*3/uL (ref 4.6–10.2)

## 2017-12-30 NOTE — Patient Instructions (Addendum)
Return to clinic for reassessment and xray with a different provider    If you have lab work done today you will be contacted with your lab results within the next 2 weeks.  If you have not heard from [place] then please contact us. The fastest way to get your results is to register for My Chart.   IF you received an x-ray today, you will receive an invoice from [facility]. Please contact [facility] at [phone] with questions or concerns regarding your invoice.   IF you received labwork today, you will receive an invoice from [place]. Please contact [facility] at [phone] with questions or concerns regarding your invoice.   Our billing staff will not be able to assist you with questions regarding bills from these companies.  You will be contacted with the lab results as soon as they are available. The fastest way to get your results is to activate your My Chart account. Instructions are located on the last page of this paperwork. If you have not heard from [place] regarding the results in 2 weeks, please contact this office.      Cough, Adult  Coughing is a reflex that clears your throat and your airways. Coughing helps to heal and protect your lungs. It is normal to cough occasionally, but a cough that happens with other symptoms or lasts a long time may be a sign of a condition that needs treatment. A cough may last only 2-3 weeks (acute), or it may last longer than 8 weeks (chronic). What are the causes? Coughing is commonly caused by:  Breathing in substances that irritate your lungs.  A viral or bacterial respiratory infection.  Allergies.  Asthma.  Postnasal drip.  Smoking.  Acid backing up from the stomach into the esophagus (gastroesophageal reflux).  Certain medicines.  Chronic lung problems, including COPD (or rarely, lung cancer).  Other medical conditions such as heart failure. Follow these instructions at home: Pay attention to any changes in  your symptoms. Take these actions to help with your discomfort:  Take medicines only as told by your health care provider. ? If you were prescribed an antibiotic medicine, take it as told by your health care provider. Do not stop taking the antibiotic even if you start to feel better. ? Talk with your health care provider before you take a cough suppressant medicine.  Drink enough fluid to keep your urine clear or pale yellow.  If the air is dry, use a cold steam vaporizer or humidifier in your bedroom or your home to help loosen secretions.  Avoid anything that causes you to cough at work or at home.  If your cough is worse at night, try sleeping in a semi-upright position.  Avoid cigarette smoke. If you smoke, quit smoking. If you need help quitting, ask your health care provider.  Avoid caffeine.  Avoid alcohol.  Rest as needed. Contact a health care provider if:  You have new symptoms.  You cough up pus.  Your cough does not get better after 2-3 weeks, or your cough gets worse.  You cannot control your cough with suppressant medicines and you are losing sleep.  You develop pain that is getting worse or pain that is not controlled with pain medicines.  You have a fever.  You have unexplained weight loss.  You have night sweats. Get help right away if:  You cough up blood.  You have difficulty breathing.  Your heartbeat is very fast. This information is not intended  to replace advice given to you by your health care provider. Make sure you discuss any questions you have with your health care provider. Document Released: 06/18/2010 Document Revised: 05/28/2015 Document Reviewed: 02/26/2014 Elsevier Interactive Patient Education  2019 [employer].

## 2017-12-30 NOTE — Progress Notes (Signed)
Established Patient Office Visit  Subjective:  Patient ID: Ashley Flores, female    DOB: Apr 04, 1950  Age: 67 y.o. MRN: [id number]  CC:  Chief Complaint  Patient presents with  . Bronchitis    follow-up 12/6 pt states she feels no better     HPI Ashley Flores presents for continued persistent cough She is having clear sputum She states she has been coughing consistently with any improvement Her cough started end of October 2019 She was evaluated and [facility] on 11/29 and CXR showed INTERSTITIAL THICKENING CONCERNING FOR EDEMA OR ATYPICAL INFECTION  She was treated with a 7 day course of doxycycline and completed that on 12/08/17  on 12/08/2017 she followed up with here and was given albuterol and mucinex She was advised to return for follow up cxr  She reports that she is afebrile but coughs to the point of vomiting She coughs more at night She is using the albuterol but not using it every 6 hours She states that she uses the albuterol at night and still is coughing to the point of fatigue She denies wheezing or shortness of breath    Past Medical History:  Diagnosis Date  . Cancer [facility])    endometrial cancer  . Obesity   . Ventral hernia     Past Surgical History:  Procedure Laterality Date  . laparoscopic surgery for fertility  40 yrs ago  . LYMPH NODE BIOPSY N/A 04/07/2015   Procedure:  SENTINEL LYMPH NODE BIOPSY;  Surgeon: Everitt Amber, MD;  Location: [facility];  Service: Gynecology;  Laterality: N/A;  . ROBOTIC ASSISTED TOTAL HYSTERECTOMY WITH BILATERAL SALPINGO OOPHERECTOMY Bilateral 04/07/2015   Procedure: XI ROBOTIC ASSISTED TOTAL LAPAROSCOPIC  HYSTERECTOMY WITH BILATERAL SALPINGO OOPHORECTOMY;  Surgeon: Everitt Amber, MD;  Location: [facility];  Service: Gynecology;  Laterality: Bilateral;  . VENTRAL HERNIA REPAIR N/A 04/07/2015   Procedure: LAPAROSCOPIC VENTRAL HERNIA  REPAIR WITH MESH;  Surgeon: Johnathan Hausen, MD;  Location: [facility];  Service: General;  Laterality: N/A;     Family History  Problem Relation Age of Onset  . Cancer Mother   . Hypertension Mother   . Stroke Mother     Social History   Socioeconomic History  . Marital status: Married    Spouse name: Not on file  . Number of children: Not on file  . Years of education: Not on file  . Highest education level: Not on file  Occupational History  . Occupation: retired  Scientific laboratory technician  . Financial resource strain: Not hard at all  . Food insecurity:    Worry: Never true    Inability: Never true  . Transportation needs:    Medical: No    Non-medical: No  Tobacco Use  . Smoking status: Never Smoker  . Smokeless tobacco: Never Used  Substance and Sexual Activity  . Alcohol use: No  . Drug use: No  . Sexual activity: Yes    Birth control/protection: None, Post-menopausal  Lifestyle  . Physical activity:    Days per week: 5 days    Minutes per session: 30 min  . Stress: Only a little  Relationships  . Social connections:    Talks on phone: Three times a week    Gets together: Three times a week    Attends religious service: More than 4 times per year    Active member of club or organization: Yes    Attends meetings of clubs or organizations: More than 4 times per  year    Relationship status: Married  . Intimate partner violence:    Fear of current or ex partner: No    Emotionally abused: No    Physically abused: No    Forced sexual activity: No  Other Topics Concern  . Not on file  Social History Narrative  . Not on file    Outpatient Medications Prior to Visit  Medication Sig Dispense Refill  . albuterol (PROVENTIL HFA;VENTOLIN HFA) 108 (90 Base) MCG/ACT inhaler Inhale 2 puffs into the lungs every 6 (six) hours as needed for wheezing or shortness of breath. 1 Inhaler 0  . cetirizine (ZYRTEC) 10 MG tablet Take 1 tablet (10 mg total) by mouth daily. 30 tablet 11  . fluticasone (FLONASE) 50 MCG/ACT nasal spray Place 2 sprays into both nostrils daily. 16 g 6  . guaiFENesin  (MUCINEX) 600 MG 12 hr tablet Take 1 tablet (600 mg total) by mouth 2 (two) times daily. 30 tablet 1  . benzonatate (TESSALON) 100 MG capsule Take 1 capsule (100 mg total) by mouth 3 (three) times daily as needed for cough. 20 capsule 2  . doxycycline (DORYX) 100 MG EC tablet Take 100 mg by mouth daily.     No facility-administered medications prior to visit.     Allergies  Allergen Reactions  . Pyridium [Phenazopyridine Hcl] Other (See Comments)    Made pt feel out of it, room spinning, hallucinations.     ROS Review of Systems See hpi  Review of Systems  Genitourinary: Negative for difficulty urinating, dysuria, flank pain and hematuria.  Musculoskeletal: Negative for back pain, joint swelling and neck pain.  Neurological: Negative for dizziness, speech difficulty, light-headedness and numbness.      Objective:    Physical Exam  BP 138/78   Pulse 80   Temp 98.5 F (36.9 C) (Oral)   Resp 16   Ht 5' 1.81" (1.57 m)   Wt 251 lb (113.9 kg)   SpO2 93%   BMI 46.19 kg/m  Wt Readings from Last 3 Encounters:  12/30/17 251 lb (113.9 kg)  12/08/17 256 lb 9.6 oz (116.4 kg)  11/28/17 261 lb 12.8 oz (118.8 kg)   General: alert, oriented, in NAD Head: normocephalic, atraumatic, no sinus tenderness Eyes: EOM intact, no scleral icterus or conjunctival injection Ears: TM clear bilaterally Nose: mucosa nonerythematous, nonedematous Throat: no pharyngeal exudate or erythema Lymph: no posterior auricular, submental or cervical lymph adenopathy Heart: normal rate, normal sinus rhythm, no murmurs Lungs: clear to auscultation bilaterally, no wheezing     XR CHEST PA AND LATERAL 12/01/17 Narrative:  TECHNIQUE: XR CHEST PA AND LATERAL  INDICATION: Cough  COMPARISON: None  FINDINGS: Normal cardiac and mediastinal contours. No pleural effusion or pneumothorax. There is mild diffuse bilateral interstitial thickening. No confluent consolidation. Mild degenerative changes in the  thoracic spine.  Impression:  IMPRESSION: Bilateral interstitial thickening which could reflect edema or atypical infection.  Electronically Signed by: Sol Passer   ECG: ECG Results  None    Health Maintenance Due  Topic Date Due  . Hepatitis C Screening  09-09-1950  . HIV Screening  06/28/1967  . MAMMOGRAM  06/28/2002  . COLONOSCOPY  06/28/2002  . DEXA SCAN  06/27/2017  . PNA vac Low Risk Adult (1 of 2 - PCV13) 06/27/2017  . INFLUENZA VACCINE  08/03/2017  . PAP SMEAR-Modifier  02/08/2018    There are no preventive care reminders to display for this patient.      Assessment & Plan:  Problem List Items Addressed This Visit    None    Visit Diagnoses    Abnormal chest x-ray    -  Primary   Cough       Relevant Orders   POCT CBC (Completed)   Brain natriuretic peptide (Completed)     Persistent cough without any improvement after doxycycline (which covers atypicals) or with albuterol  Since cxr was abnormal then should get a repeat xray She states that she would like to be seen for repeat xray and reassessment of the cough  She should either go to [facility] or to return for follow up with another provider since I will be out of the office She was given ER precautions  BNP and CBC reassuring. Will await repeat CXR  No orders of the defined types were placed in this encounter.  A total of 25 minutes were spent face-to-face with the patient during this encounter and over half of that time was spent on counseling and coordination of care.  Patient and her husband were struggling with where they should go next whether go to urgent care at [facility] or wait for follow up xray in the office. Spent time reviewing cbc, previous xray and antibiotic selection. Discussed continue albuterol and follow up for xray.   Follow-up: Return for return January 2nd for appt.    Forrest Moron, MD

## 2018-01-01 ENCOUNTER — Telehealth: Payer: Self-pay | Admitting: *Deleted

## 2018-01-01 LAB — BRAIN NATRIURETIC PEPTIDE: BNP: 20.5 pg/mL (ref 0.0–100.0)

## 2018-01-01 NOTE — Telephone Encounter (Signed)
Patient called to schedule a follow up appt for April. Appt scheduled for April 16th at 2:15pm

## 2018-01-04 ENCOUNTER — Ambulatory Visit (INDEPENDENT_AMBULATORY_CARE_PROVIDER_SITE_OTHER): Payer: BLUE CROSS/BLUE SHIELD

## 2018-01-04 ENCOUNTER — Ambulatory Visit: Payer: BLUE CROSS/BLUE SHIELD | Admitting: Emergency Medicine

## 2018-01-04 ENCOUNTER — Encounter: Payer: Self-pay | Admitting: Emergency Medicine

## 2018-01-04 ENCOUNTER — Other Ambulatory Visit: Payer: Self-pay

## 2018-01-04 VITALS — BP 113/73 | HR 82 | Temp 98.4°F | Resp 16 | Ht 62.0 in | Wt 247.6 lb

## 2018-01-04 DIAGNOSIS — J069 Acute upper respiratory infection, unspecified: Secondary | ICD-10-CM

## 2018-01-04 DIAGNOSIS — R05 Cough: Secondary | ICD-10-CM

## 2018-01-04 DIAGNOSIS — R059 Cough, unspecified: Secondary | ICD-10-CM

## 2018-01-04 DIAGNOSIS — R062 Wheezing: Secondary | ICD-10-CM | POA: Diagnosis not present

## 2018-01-04 DIAGNOSIS — B9789 Other viral agents as the cause of diseases classified elsewhere: Secondary | ICD-10-CM

## 2018-01-04 MED ORDER — PREDNISONE 20 MG PO TABS: 40.0000 mg | ORAL_TABLET | Freq: Every day | ORAL | 0 refills | Status: AC

## 2018-01-04 MED ORDER — PROMETHAZINE-CODEINE 6.25-10 MG/5ML PO SYRP: 5.0000 mL | ORAL_SOLUTION | Freq: Every evening | ORAL | 0 refills | Status: DC | PRN

## 2018-01-04 MED ORDER — BENZONATATE 200 MG PO CAPS: 200.0000 mg | ORAL_CAPSULE | Freq: Two times a day (BID) | ORAL | 0 refills | Status: DC | PRN

## 2018-01-04 NOTE — Patient Instructions (Addendum)
If you have lab work done today you will be contacted with your lab results within the next 2 weeks.  If you have not heard from [place] then please contact us. The fastest way to get your results is to register for My Chart.   IF you received an x-ray today, you will receive an invoice from [facility] Radiology. Please contact [facility] Radiology at [phone] with questions or concerns regarding your invoice.   IF you received labwork today, you will receive an invoice from [place]. Please contact [facility] at [phone] with questions or concerns regarding your invoice.   Our billing staff will not be able to assist you with questions regarding bills from these companies.  You will be contacted with the lab results as soon as they are available. The fastest way to get your results is to activate your My Chart account. Instructions are located on the last page of this paperwork. If you have not heard from [place] regarding the results in 2 weeks, please contact this office.    We recommend that you schedule a mammogram for breast cancer screening. Typically, you do not need a referral to do this. Please contact a local imaging center to schedule your mammogram.  [facility] - [phone]  *ask for the Radiology Department [facility] ([place]) - [phone] or [phone]  [facility] - [phone] [facility] - [phone]  *ask for the [facility] - [phone]  *ask for the Radiology Department [facility] - [phone]  *ask for the [facility] - [phone] Cough, Adult  A cough helps to clear your throat and lungs. A cough may last only 2-3 weeks (acute), or it may last longer than 8 weeks (chronic). Many different things can cause a cough. A cough may be a sign of an illness or another medical  condition. Follow these instructions at home:  Pay attention to any changes in your cough.  Take medicines only as told by your doctor. ? If you were prescribed an antibiotic medicine, take it as told by your doctor. Do not stop taking it even if you start to feel better. ? Talk with your doctor before you try using a cough medicine.  Drink enough fluid to keep your pee (urine) clear or pale yellow.  If the air is dry, use a cold steam vaporizer or humidifier in your home.  Stay away from things that make you cough at work or at home.  If your cough is worse at night, try using extra pillows to raise your head up higher while you sleep.  Do not smoke, and try not to be around smoke. If you need help quitting, ask your doctor.  Do not have caffeine.  Do not drink alcohol.  Rest as needed. Contact a doctor if:  You have new problems (symptoms).  You cough up yellow fluid (pus).  Your cough does not get better after 2-3 weeks, or your cough gets worse.  Medicine does not help your cough and you are not sleeping well.  You have pain that gets worse or pain that is not helped with medicine.  You have a fever.  You are losing weight and you do not know why.  You have night sweats. Get help right away if:  You cough up blood.  You have trouble breathing.  Your heartbeat is very fast.  This information is not intended to replace advice given to you by your health care provider. Make sure you discuss any questions you have with your health care provider. Document Released: 09/02/2010 Document Revised: 05/28/2015 Document Reviewed: 02/26/2014 Elsevier Interactive Patient Education  2019 [employer].

## 2018-01-04 NOTE — Progress Notes (Addendum)
Ashley Flores 68 y.o.   Chief Complaint  Patient presents with  . Cough    PER PATIENT HERE FOR FOLLOW UP XRAY    HISTORY OF PRESENT ILLNESS: This is a 68 y.o. female here for follow-up of abnormal chest x-ray.  Seen here on 12/30/2017 by Dr. Nolon Rod.  Patient has been having a chronic cough since the beginning of last November.  Husband states her coughing spells are mostly at night and get really bad.  She would cough for 10 to 15 minutes, feel like she could not breathe, and then spontaneously get better.  Overall however feels better than several weeks ago.  Still gets occasional wheezing.  Non-smoker.  No history of asthma or COPD.  Has been taking Zyrtec, Flonase, albuterol, and Mucinex.  States she also took [employer]  No new symptoms.  X-ray done urgent care center showed bilateral interstitial thickening. Took a course of doxycycline for 7 days.  No history of CHF.  Had a normal BNP level and normal CBC. HPI   Prior to Admission medications   Medication Sig Start Date End Date Taking? Authorizing Provider  albuterol (PROVENTIL HFA;VENTOLIN HFA) 108 (90 Base) MCG/ACT inhaler Inhale 2 puffs into the lungs every 6 (six) hours as needed for wheezing or shortness of breath. 12/25/17  Yes Forrest Moron, MD  cetirizine (ZYRTEC) 10 MG tablet Take 1 tablet (10 mg total) by mouth daily. 11/28/17  Yes Stallings, Zoe A, MD  fluticasone (FLONASE) 50 MCG/ACT nasal spray Place 2 sprays into both nostrils daily. 11/28/17  Yes Forrest Moron, MD  guaiFENesin (MUCINEX) 600 MG 12 hr tablet Take 1 tablet (600 mg total) by mouth 2 (two) times daily. 12/08/17  Yes Forrest Moron, MD    Allergies  Allergen Reactions  . Pyridium [Phenazopyridine Hcl] Other (See Comments)    Made pt feel out of it, room spinning, hallucinations.     Patient Active Problem List   Diagnosis Date Noted  . Viral URI with cough 11/28/2017  . Non-seasonal allergic rhinitis due to pollen 11/28/2017  .  Endometrial cancer ([place]) 04/07/2015  . Umbilical hernia, incarcerated 04/07/2015  . Obesity     Past Medical History:  Diagnosis Date  . Cancer [facility])    endometrial cancer  . Obesity   . Ventral hernia     Past Surgical History:  Procedure Laterality Date  . laparoscopic surgery for fertility  40 yrs ago  . LYMPH NODE BIOPSY N/A 04/07/2015   Procedure:  SENTINEL LYMPH NODE BIOPSY;  Surgeon: Everitt Amber, MD;  Location: [facility];  Service: Gynecology;  Laterality: N/A;  . ROBOTIC ASSISTED TOTAL HYSTERECTOMY WITH BILATERAL SALPINGO OOPHERECTOMY Bilateral 04/07/2015   Procedure: XI ROBOTIC ASSISTED TOTAL LAPAROSCOPIC  HYSTERECTOMY WITH BILATERAL SALPINGO OOPHORECTOMY;  Surgeon: Everitt Amber, MD;  Location: [facility];  Service: Gynecology;  Laterality: Bilateral;  . VENTRAL HERNIA REPAIR N/A 04/07/2015   Procedure: LAPAROSCOPIC VENTRAL HERNIA  REPAIR WITH MESH;  Surgeon: Johnathan Hausen, MD;  Location: [facility];  Service: General;  Laterality: N/A;    Social History   Socioeconomic History  . Marital status: Married    Spouse name: Not on file  . Number of children: Not on file  . Years of education: Not on file  . Highest education level: Not on file  Occupational History  . Occupation: retired  Scientific laboratory technician  . Financial resource strain: Not hard at all  . Food insecurity:    Worry: Never true  Inability: Never true  . Transportation needs:    Medical: No    Non-medical: No  Tobacco Use  . Smoking status: Never Smoker  . Smokeless tobacco: Never Used  Substance and Sexual Activity  . Alcohol use: No  . Drug use: No  . Sexual activity: Yes    Birth control/protection: None, Post-menopausal  Lifestyle  . Physical activity:    Days per week: 5 days    Minutes per session: 30 min  . Stress: Only a little  Relationships  . Social connections:    Talks on phone: Three times a week    Gets together: Three times a week    Attends religious service: More than 4 times per year     Active member of club or organization: Yes    Attends meetings of clubs or organizations: More than 4 times per year    Relationship status: Married  . Intimate partner violence:    Fear of current or ex partner: No    Emotionally abused: No    Physically abused: No    Forced sexual activity: No  Other Topics Concern  . Not on file  Social History Narrative  . Not on file    Family History  Problem Relation Age of Onset  . Cancer Mother   . Hypertension Mother   . Stroke Mother      Review of Systems  Constitutional: Negative.  Negative for chills, fever and malaise/fatigue.  HENT: Negative.  Negative for sore throat.   Eyes: Negative.  Negative for blurred vision and double vision.  Respiratory: Positive for cough and wheezing. Negative for hemoptysis, sputum production and shortness of breath.   Cardiovascular: Negative.  Negative for chest pain and palpitations.  Gastrointestinal: Negative.  Negative for abdominal pain, diarrhea, nausea and vomiting.  Genitourinary: Negative.  Negative for hematuria.  Musculoskeletal: Negative for back pain, myalgias and neck pain.  Skin: Negative for rash.  Neurological: Negative.  Negative for dizziness and headaches.  Endo/Heme/Allergies: Negative.     Vitals:   01/04/18 0940  BP: 113/73  Pulse: 82  Resp: 16  Temp: 98.4 F (36.9 C)  SpO2: 92%    Physical Exam Vitals signs reviewed.  Constitutional:      Appearance: She is obese.  HENT:     Head: Normocephalic and atraumatic.     Nose: Nose normal.  Eyes:     Extraocular Movements: Extraocular movements intact.     Pupils: Pupils are equal, round, and reactive to light.  Neck:     Musculoskeletal: Normal range of motion and neck supple.  Cardiovascular:     Rate and Rhythm: Normal rate and regular rhythm.     Heart sounds: Normal heart sounds.  Pulmonary:     Effort: Pulmonary effort is normal.     Breath sounds: Wheezing (Intermittent and diffuse) present. No  rhonchi or rales.  Musculoskeletal: Normal range of motion.  Skin:    General: Skin is warm and dry.     Capillary Refill: Capillary refill takes less than 2 seconds.  Neurological:     General: No focal deficit present.     Mental Status: She is alert and oriented to person, place, and time.  Psychiatric:        Mood and Affect: Mood normal.        Behavior: Behavior normal.    Dg Chest 2 View  Result Date: 01/04/2018 CLINICAL DATA:  Cough EXAM: CHEST - 2 VIEW COMPARISON:  December 27, 2004 FINDINGS: There is no appreciable edema or consolidation. Heart size and pulmonary vascularity are normal. No adenopathy. There is degenerative change in the thoracic spine. IMPRESSION: No edema or consolidation. Electronically Signed   By: Lowella Grip III M.D.   On: 01/04/2018 09:56     ASSESSMENT & PLAN: Akshitha was seen today for cough.  Diagnoses and all orders for this visit:  Viral URI with cough -     predniSONE (DELTASONE) 20 MG tablet; Take 2 tablets (40 mg total) by mouth daily with breakfast for 5 days. -     promethazine-codeine (PHENERGAN WITH CODEINE) 6.25-10 MG/5ML syrup; Take 5 mLs by mouth at bedtime as needed for cough. -     benzonatate (TESSALON) 200 MG capsule; Take 1 capsule (200 mg total) by mouth 2 (two) times daily as needed for cough.  Cough -     DG Chest 2 View; Future  Wheezing -     Ambulatory referral to Pulmonology    Patient Instructions       If you have lab work done today you will be contacted with your lab results within the next 2 weeks.  If you have not heard from [place] then please contact us. The fastest way to get your results is to register for My Chart.   IF you received an x-ray today, you will receive an invoice from [facility] Radiology. Please contact [facility] Radiology at [phone] with questions or concerns regarding your invoice.   IF you received labwork today, you will receive an invoice from [facility]. Please contact [facility]  at [phone] with questions or concerns regarding your invoice.   Our billing staff will not be able to assist you with questions regarding bills from these companies.  You will be contacted with the lab results as soon as they are available. The fastest way to get your results is to activate your My Chart account. Instructions are located on the last page of this paperwork. If you have not heard from [place] regarding the results in 2 weeks, please contact this office.    We recommend that you schedule a mammogram for breast cancer screening. Typically, you do not need a referral to do this. Please contact a local imaging center to schedule your mammogram.  [facility] - [phone]  *ask for the Radiology Department [facility] ([place]) - [phone] or [phone]  [facility] - [phone] [place] [phone] [facility] - [phone]  *ask for the [facility] - [phone]  *ask for the Radiology Department [facility] - [phone]  *ask for the [facility] - [phone] Cough, Adult  A cough helps to clear your throat and lungs. A cough may last only 2-3 weeks (acute), or it may last longer than 8 weeks (chronic). Many different things can cause a cough. A cough may be a sign of an illness or another medical condition. Follow these instructions at home:  Pay attention to any changes in your cough.  Take medicines only as told by your doctor. ? If you were prescribed an antibiotic medicine, take it as told by your doctor. Do not stop taking it even if you start to feel better. ? Talk with your doctor before you try using a cough medicine.  Drink enough fluid to keep your pee (urine) clear or pale yellow.  If the air is dry, use a cold steam  vaporizer or humidifier in your home.  Stay away from things that make you  cough at work or at home.  If your cough is worse at night, try using extra pillows to raise your head up higher while you sleep.  Do not smoke, and try not to be around smoke. If you need help quitting, ask your doctor.  Do not have caffeine.  Do not drink alcohol.  Rest as needed. Contact a doctor if:  You have new problems (symptoms).  You cough up yellow fluid (pus).  Your cough does not get better after 2-3 weeks, or your cough gets worse.  Medicine does not help your cough and you are not sleeping well.  You have pain that gets worse or pain that is not helped with medicine.  You have a fever.  You are losing weight and you do not know why.  You have night sweats. Get help right away if:  You cough up blood.  You have trouble breathing.  Your heartbeat is very fast. This information is not intended to replace advice given to you by your health care provider. Make sure you discuss any questions you have with your health care provider. Document Released: 09/02/2010 Document Revised: 05/28/2015 Document Reviewed: 02/26/2014 Elsevier Interactive Patient Education  2019 [employer]      Agustina Caroli, MD [facility]

## 2018-01-19 ENCOUNTER — Ambulatory Visit: Payer: BLUE CROSS/BLUE SHIELD | Admitting: Family Medicine

## 2018-03-05 DIAGNOSIS — J209 Acute bronchitis, unspecified: Secondary | ICD-10-CM | POA: Diagnosis not present

## 2018-03-05 DIAGNOSIS — R030 Elevated blood-pressure reading, without diagnosis of hypertension: Secondary | ICD-10-CM | POA: Diagnosis not present

## 2018-03-30 ENCOUNTER — Telehealth: Payer: Self-pay | Admitting: *Deleted

## 2018-03-30 NOTE — Telephone Encounter (Signed)
Called and spoke with the patient regarding her appt on 4/14. Rescheduled appt to 5/27 due to [id number]-02 and hospital policy. Explained that she will receive a call the day before to be prescreened and then again the day of the appt at the front desk, along with a temperature check. Also explained the no visitor policy

## 2018-04-19 ENCOUNTER — Ambulatory Visit: Payer: BLUE CROSS/BLUE SHIELD | Admitting: Gynecologic Oncology

## 2018-05-15 ENCOUNTER — Telehealth: Payer: Self-pay | Admitting: Family Medicine

## 2018-05-15 ENCOUNTER — Telehealth: Payer: Self-pay | Admitting: Internal Medicine

## 2018-05-15 NOTE — Telephone Encounter (Signed)
Copied from [place] [phone]. Topic: General - Other >> May 15, 2018  4:11 PM Rainey Pines A wrote: Patient Name/DOB/MRN #:[id number]/[id number] Requestor Name/Agency: Patient Call Back #: [id number] Information Requested: Patient would like her X-rays from 12/30/2017 and 01/04/2018 sent to [facility] [phone]

## 2018-05-15 NOTE — Telephone Encounter (Signed)
Called and spoke with patient regarding prior message Pt advised nothing further is needed at this time She already scheduled appt that was needed

## 2018-05-25 ENCOUNTER — Telehealth: Payer: Self-pay | Admitting: Internal Medicine

## 2018-05-25 ENCOUNTER — Telehealth: Payer: Self-pay | Admitting: *Deleted

## 2018-05-25 NOTE — Telephone Encounter (Signed)
Patient called and moved her appt from 5/27 to 7/1. Patient sated "I'm not comfortable coming in right now with the COVID and I  Can't wear a mask."

## 2018-05-25 NOTE — Telephone Encounter (Signed)
I called [facility] [phone] to get fax #. They are closed. I will call back.

## 2018-05-25 NOTE — Telephone Encounter (Signed)
Returned call to patient, she wanted to be sure her CXR from January was viewable before coming to her consult visit in case she needed to get them. Made her aware her CXR from January in [place] and November in care Everywhere was viewable as well. Voiced understanding. Nothing further is needed at this time.

## 2018-05-29 NOTE — Telephone Encounter (Signed)
Faxed and got a confirmation.

## 2018-05-30 ENCOUNTER — Ambulatory Visit: Payer: BLUE CROSS/BLUE SHIELD | Admitting: Gynecologic Oncology

## 2018-05-31 ENCOUNTER — Ambulatory Visit: Payer: BLUE CROSS/BLUE SHIELD | Admitting: Internal Medicine

## 2018-05-31 ENCOUNTER — Other Ambulatory Visit: Payer: Self-pay

## 2018-05-31 ENCOUNTER — Encounter: Payer: Self-pay | Admitting: Internal Medicine

## 2018-05-31 ENCOUNTER — Ambulatory Visit (INDEPENDENT_AMBULATORY_CARE_PROVIDER_SITE_OTHER): Payer: BLUE CROSS/BLUE SHIELD

## 2018-05-31 VITALS — BP 140/80 | HR 92 | Ht 63.0 in | Wt 244.0 lb

## 2018-05-31 DIAGNOSIS — R05 Cough: Secondary | ICD-10-CM | POA: Diagnosis not present

## 2018-05-31 DIAGNOSIS — J45991 Cough variant asthma: Secondary | ICD-10-CM | POA: Diagnosis not present

## 2018-05-31 DIAGNOSIS — J219 Acute bronchiolitis, unspecified: Secondary | ICD-10-CM

## 2018-05-31 DIAGNOSIS — J209 Acute bronchitis, unspecified: Secondary | ICD-10-CM | POA: Diagnosis not present

## 2018-05-31 MED ORDER — ALBUTEROL SULFATE HFA 108 (90 BASE) MCG/ACT IN AERS: 2.0000 | INHALATION_SPRAY | Freq: Four times a day (QID) | RESPIRATORY_TRACT | 1 refills | Status: DC | PRN

## 2018-05-31 MED ORDER — FAMOTIDINE 20 MG PO TABS: ORAL_TABLET | ORAL | 11 refills | Status: DC

## 2018-05-31 MED ORDER — PREDNISONE 10 MG PO TABS: ORAL_TABLET | ORAL | 0 refills | Status: DC

## 2018-05-31 MED ORDER — PANTOPRAZOLE SODIUM 40 MG PO TBEC: 40.0000 mg | DELAYED_RELEASE_TABLET | Freq: Every day | ORAL | 2 refills | Status: DC

## 2018-05-31 NOTE — Progress Notes (Signed)
Ashley Flores, female    DOB: October 24, 1950,   MRN: [id number]   Brief patient profile:  76 yowf never smoker no resp problems at all in childhood with last IUP in 1980 s resp problems and returned to baseline wt around 140-150 and then progressive wt gain and no problems in mid Oct  2020 with onset URI (which rarely gets) = stuffy nose, eyes running , no st or fever then bad cough more productive > eval at [facility] rec mucinex/ Tessalon / cough syrup some better then worse when stopped so returned to [facility] > pred x 6 days much better and worse off it so 3rd ov  Jan 04 2018 > pred and abx and again better only a few days but delayed f/u due to Covid-19 and referred to pulmonary clinic 05/31/2018 by [facility]      History of Present Illness  05/31/2018  Pulmonary/ 1st office eval/[facility]  Chief Complaint  Patient presents with  . Pulmonary Consult    Referred by [facility]. Pt c/o wheezing and chest congestion off and on since Fall 2019.    Dyspnea:  Walks regularly up and down street avg pace / flat surface, no doe or cough/ wheeze with walking Cough: see below > clear mucus, min production/ occ urinary incont  Sleep: ok  flat L side down/ one pillow / does not typically  wake up her up  SABA use: not used in over a week but seems to help Zyrtec did help drippy nose but not deep sense "congestion"      Kouffman Reflux v Neurogenic Cough Differentiator Reflux Comments  Do you awaken from a sound sleep coughing violently?                            With trouble breathing? First 10 min yes   Do you have choking episodes when you cannot  Get enough air, gasping for air ?              Yes if don't cough up mucus   Do you usually cough when you lie down into  The bed, or when you just lie down to rest ?                          Yes   Do you usually cough after meals or eating?         sometimes   Do you cough when (or after) you bend over?    No    GERD SCORE     Kouffman Reflux v  Neurogenic Cough Differentiator Neurogenic   Do you more-or-less cough all day long? sporadic   Does change of temperature make you cough? no   Does laughing or chuckling cause you to cough? some   Do fumes (perfume, automobile fumes, burned  Toast, etc.,) cause you to cough ?      No    Does speaking, singing, or talking on the phone cause you to cough   ?               No   Neurogenic/Airway score      No obvious day to day or daytime variability or assoc excess/ purulent sputum or mucus plugs or hemoptysis or cp or chest tightness, subjective wheeze or overt sinus or hb symptoms.   Sleeping as above without nocturnal  or early am exacerbation  of respiratory  c/o's or need for noct saba. Also denies any obvious fluctuation of symptoms with weather or environmental changes or other aggravating or alleviating factors except as outlined above   No unusual exposure hx or h/o childhood pna/ asthma or knowledge of premature birth.  Current Allergies, Complete Past Medical History, Past Surgical History, Family History, and Social History were reviewed in [employer] record.  ROS  The following are not active complaints unless bolded Hoarseness, sore throat, dysphagia, dental problems, itching, sneezing,  nasal congestion or discharge of excess mucus or purulent secretions, ear ache,   fever, chills, sweats, unintended wt loss or wt gain, classically pleuritic or exertional cp,  orthopnea pnd or arm/hand swelling  or leg swelling, presyncope, palpitations, abdominal pain, anorexia, nausea, vomiting, diarrhea  or change in bowel habits or change in bladder habits, change in stools or change in urine, dysuria, hematuria,  rash, arthralgias, visual complaints, headache, numbness, weakness or ataxia or problems with walking or coordination,  change in mood or  memory.            Past Medical History:  Diagnosis Date  . Cancer [facility])    endometrial cancer  . Obesity   .  Ventral hernia     Outpatient Medications Prior to Visit  Medication Sig Dispense Refill  . cetirizine (ZYRTEC) 10 MG tablet Take 1 tablet (10 mg total) by mouth daily. (Patient taking differently: Take 10 mg by mouth daily as needed. ) 30 tablet 11  . albuterol (PROVENTIL HFA;VENTOLIN HFA) 108 (90 Base) MCG/ACT inhaler Inhale 2 puffs into the lungs every 6 (six) hours as needed for wheezing or shortness of breath. 1 Inhaler 0  . benzonatate (TESSALON) 200 MG capsule Take 1 capsule (200 mg total) by mouth 2 (two) times daily as needed for cough. 20 capsule 0  . fluticasone (FLONASE) 50 MCG/ACT nasal spray Place 2 sprays into both nostrils daily. 16 g 6  . guaiFENesin (MUCINEX) 600 MG 12 hr tablet Take 1 tablet (600 mg total) by mouth 2 (two) times daily. 30 tablet 1  . promethazine-codeine (PHENERGAN WITH CODEINE) 6.25-10 MG/5ML syrup Take 5 mLs by mouth at bedtime as needed for cough. 120 mL 0      Objective:     BP 140/80 (BP Location: Left Arm, Cuff Size: Large)   Pulse 92   Ht 5\' 3"  (1.6 m)   Wt 244 lb (110.7 kg)   SpO2 99%   BMI 43.22 kg/m   SpO2: 99 % RA   HEENT: nl dentition, turbinates bilaterally, and oropharynx. Nl external ear canals without cough reflex   NECK :  without JVD/Nodes/TM/ nl carotid upstrokes bilaterally   LUNGS: no acc muscle use,  Nl contour chest which is clear to A and P bilaterally without cough on insp or exp maneuvers   CV:  RRR  no s3 or murmur or increase in P2, and no edema   ABD:  soft and nontender with nl inspiratory excursion in the supine position. No bruits or organomegaly appreciated, bowel sounds nl  MS:  Nl gait/ ext warm without deformities, calf tenderness, cyanosis or clubbing No obvious joint restrictions   SKIN: warm and dry without lesions    NEURO:  alert, approp, nl sensorium with  no motor or cerebellar deficits apparent.     CXR PA and Lateral:   05/31/2018 :    I personally reviewed images   impression as  follows:   ? Very subtle  RN lung dz but no real chagne since 01/04/18        Assessment   Cough variant asthma vs UACS The most common causes of chronic cough in immunocompetent adults include the following: upper airway cough syndrome (UACS), previously referred to as postnasal drip syndrome (PNDS), which is caused by variety of rhinosinus conditions; (2) asthma; (3) GERD; (4) chronic bronchitis from cigarette smoking or other inhaled environmental irritants; (5) nonasthmatic eosinophilic bronchitis; and (6) bronchiectasis.   These conditions, singly or in combination, have accounted for up to 94% of the causes of chronic cough in prospective studies.   Other conditions have constituted no >6% of the causes in prospective studies These have included bronchogenic carcinoma, chronic interstitial pneumonia, sarcoidosis, left ventricular failure, ACEI-induced cough, and aspiration from a condition associated with pharyngeal dysfunction.    Chronic cough is often simultaneously caused by more than one condition. A single cause has been found from 38 to 82% of the time, multiple causes from 18 to 62%. Multiply caused cough has been the result of three diseases up to 42% of the time.    Temporary response to prednisone is suggestive of asthma but of the three most common causes of  Sub-acute / recurrent or chronic cough, only one (GERD)  can actually contribute to/ trigger  the other two (asthma and post nasal drip syndrome)  and perpetuate the cylce of cough.  While not intuitively obvious, many patients with chronic low grade reflux do not cough until there is a primary insult that disturbs the protective epithelial barrier and exposes sensitive nerve endings.   This is typically viral but can due to PNDS and  either may apply here.      >>> The point is that once this occurs, it is difficult to eliminate the cycle  using anything but a maximally effective acid suppression regimen at least in the  short run, accompanied by an appropriate diet to address non acid GERD / rx with Pred x 6 days for any Th-2 driven upper or lower airways inflammation (as this seems to have helped in the past) and control / eliminate the cough itself for at least 5 days with delsym and then regroup in a few weeks if not gone and stays gone this time with sinus ct and probably HRCT chest.        Morbid (severe) obesity due to excess calories ([place]) Body mass index is 43.22 kg/m.    No results found for: TSH   Contributing to gerd risk/ doe/reviewed the need and the process to achieve and maintain neg calorie balance > defer f/u primary care including intermittently monitoring thyroid status          Total time devoted to counseling  > 50 % of initial 60 min office visit:  review case with pt/ discussion of options/alternatives/ personally creating written customized instructions  in presence of pt  then going over those specific  Instructions directly with the pt including how to use all of the meds but in particular covering each new medication in detail and the difference between the maintenance= "automatic" meds and the prns using an action plan format for the latter (If this problem/symptom => do that organization reading Left to right).  Please see AVS from this visit for a full list of these instructions which I personally wrote for this pt and  are unique to this visit.      Christinia Gully, MD 05/31/2018

## 2018-05-31 NOTE — Assessment & Plan Note (Addendum)
The most common causes of chronic cough in immunocompetent adults include the following: upper airway cough syndrome (UACS), previously referred to as postnasal drip syndrome (PNDS), which is caused by variety of rhinosinus conditions; (2) asthma; (3) GERD; (4) chronic bronchitis from cigarette smoking or other inhaled environmental irritants; (5) nonasthmatic eosinophilic bronchitis; and (6) bronchiectasis.   These conditions, singly or in combination, have accounted for up to 94% of the causes of chronic cough in prospective studies.   Other conditions have constituted no >6% of the causes in prospective studies These have included bronchogenic carcinoma, chronic interstitial pneumonia, sarcoidosis, left ventricular failure, ACEI-induced cough, and aspiration from a condition associated with pharyngeal dysfunction.    Chronic cough is often simultaneously caused by more than one condition. A single cause has been found from 38 to 82% of the time, multiple causes from 18 to 62%. Multiply caused cough has been the result of three diseases up to 42% of the time.    Temporary response to prednisone is suggestive of asthma but of the three most common causes of  Sub-acute / recurrent or chronic cough, only one (GERD)  can actually contribute to/ trigger  the other two (asthma and post nasal drip syndrome)  and perpetuate the cylce of cough.  While not intuitively obvious, many patients with chronic low grade reflux do not cough until there is a primary insult that disturbs the protective epithelial barrier and exposes sensitive nerve endings.   This is typically viral but can due to PNDS and  either may apply here.      >>> The point is that once this occurs, it is difficult to eliminate the cycle  using anything but a maximally effective acid suppression regimen at least in the short run, accompanied by an appropriate diet to address non acid GERD / rx with Pred x 6 days for any Th-2 driven upper or  lower airways inflammation (as this seems to have helped in the past) and control / eliminate the cough itself for at least 5 days with delsym and then regroup in a few weeks if not gone and stays gone this time with sinus ct and probably HRCT chest.

## 2018-05-31 NOTE — Assessment & Plan Note (Signed)
Body mass index is 43.22 kg/m.    No results found for: TSH   Contributing to gerd risk/ doe/reviewed the need and the process to achieve and maintain neg calorie balance > defer f/u primary care including intermittently monitoring thyroid status     Total time devoted to counseling  > 50 % of initial 60 min office visit:  review case with pt/ discussion of options/alternatives/ personally creating written customized instructions  in presence of pt  then going over those specific  Instructions directly with the pt including how to use all of the meds but in particular covering each new medication in detail and the difference between the maintenance= "automatic" meds and the prns using an action plan format for the latter (If this problem/symptom => do that organization reading Left to right).  Please see AVS from this visit for a full list of these instructions which I personally wrote for this pt and  are unique to this visit.

## 2018-05-31 NOTE — Patient Instructions (Signed)
The key to effective treatment for your cough is eliminating the non-stop cycle of cough you're stuck in long enough to let your airway heal completely and then see if there is anything still making you cough once you stop the cough suppression, but this should take no more than 5 days to figure out  First take delsym two tsp every 12 hours  As needed until longer coughing at all x 5 days   Prednisone 10 mg take  4 each am x 2 days,   2 each am x 2 days,  1 each am x 2 days and stop (this is to eliminate allergies and inflammation from coughing)  Protonix (pantoprazole) Take 30-60 min before first meal of the day and Pepcid 20 mg one after supper   until cough is completely gone for at least a week without the need for cough suppression  GERD (REFLUX)  is an extremely common cause of respiratory symptoms, many times with no significant heartburn at all.    It can be treated with medication, but also with lifestyle changes including avoidance of late meals, excessive alcohol, smoking cessation, and avoid fatty foods, chocolate, peppermint, colas, red wine, and acidic juices such as orange juice.  NO MINT OR MENTHOL PRODUCTS SO NO COUGH DROPS   USE HARD CANDY INSTEAD (jolley ranchers or Stover's or Lifesavers (all available in sugarless versions) NO OIL BASED VITAMINS - use powdered substitutes.  Please remember to go to the  x-ray department  for your tests - we will call you with the results when they are available    If not all better in two weeks please call.

## 2018-07-02 ENCOUNTER — Telehealth: Payer: Self-pay | Admitting: *Deleted

## 2018-07-02 NOTE — Telephone Encounter (Signed)
Patient called and moved her appt due to [place]. Patient still leary of coming to the clinic. Appt moved to September, advised the patient to call if she has any signs/symptoms.

## 2018-07-04 ENCOUNTER — Inpatient Hospital Stay: Payer: BLUE CROSS/BLUE SHIELD | Admitting: Gynecologic Oncology

## 2018-08-28 ENCOUNTER — Other Ambulatory Visit: Payer: Self-pay | Admitting: Internal Medicine

## 2018-08-28 DIAGNOSIS — J45991 Cough variant asthma: Secondary | ICD-10-CM

## 2018-08-28 MED ORDER — PANTOPRAZOLE SODIUM 40 MG PO TBEC: 40.0000 mg | DELAYED_RELEASE_TABLET | Freq: Every day | ORAL | 0 refills | Status: DC

## 2018-09-13 ENCOUNTER — Telehealth: Payer: Self-pay | Admitting: Internal Medicine

## 2018-09-13 DIAGNOSIS — J45991 Cough variant asthma: Secondary | ICD-10-CM

## 2018-09-13 MED ORDER — PANTOPRAZOLE SODIUM 40 MG PO TBEC: 40.0000 mg | DELAYED_RELEASE_TABLET | Freq: Every day | ORAL | 2 refills | Status: DC

## 2018-09-13 MED ORDER — FAMOTIDINE 20 MG PO TABS: ORAL_TABLET | ORAL | 2 refills | Status: DC

## 2018-09-13 NOTE — Telephone Encounter (Signed)
Spoke with pt, she wants to know if she needs to continue her medication pantoprazole and Pepcid. She states her pharmacy told her that she would need to get her refills from her primary care doctor. She states the medications are working and she wants to know if she should continue taking them. Can we refill these medications with refills? MW please advise.   Please leave detailed message with information if the patient doesn't answer. Pt requested to leave all information on the voicemail.,

## 2018-09-13 NOTE — Telephone Encounter (Signed)
Called pt and advised message from the provider. Pt understood and verbalized understanding. Nothing further is needed.   Rx's sent in and appt made for 12/24/2018 at 9 am

## 2018-09-13 NOTE — Telephone Encounter (Signed)
That's fine x 3 m then see pcp or [place] to sort out long term needs

## 2018-09-24 ENCOUNTER — Inpatient Hospital Stay: Payer: BLUE CROSS/BLUE SHIELD | Attending: Gynecologic Oncology | Admitting: Gynecologic Oncology

## 2018-09-24 ENCOUNTER — Encounter: Payer: Self-pay | Admitting: Gynecologic Oncology

## 2018-09-24 ENCOUNTER — Other Ambulatory Visit: Payer: Self-pay

## 2018-09-24 VITALS — BP 146/66 | HR 86 | Temp 97.7°F | Resp 18 | Ht 63.0 in | Wt 255.5 lb

## 2018-09-24 DIAGNOSIS — C541 Malignant neoplasm of endometrium: Secondary | ICD-10-CM | POA: Diagnosis not present

## 2018-09-24 DIAGNOSIS — Z90722 Acquired absence of ovaries, bilateral: Secondary | ICD-10-CM | POA: Diagnosis not present

## 2018-09-24 DIAGNOSIS — Z90710 Acquired absence of both cervix and uterus: Secondary | ICD-10-CM | POA: Diagnosis not present

## 2018-09-24 NOTE — Progress Notes (Signed)
ENDOMETRIAL CANCER FOLLOW-UP  Assessment:    68 y.o. year old with Stage IA Grade 3 endometrioid endometrial cancer.   S/p robotic assisted total hysterectomy, BSO, bilateral SLN biopsy and umbilical hernia repair on 04/07/15. no LVSI, no myometrial invasion,  Negative pelvic sentinel lymph nodes.  Clinically free of disease on exam.  Plan: We will continue with visits every 6 months x 5 years, then she can return to annual visits in April, 2022.  Discussed signs and symptoms of recurrence including vaginal bleeding or discharge, leg pain or swelling and changes in bowel or bladder habits. She was given the opportunity to ask questions, which were answered to her satisfaction, and she is agreement with the above mentioned plan of care.  Return to clinic in 6 months  HPI:  Ashley Flores is a 68 y.o. year old G2P2002 initially seen in consultation on 03/04/15 referred by Dr Hulan Fray for grade 3 endometrioid endometrial cancer.  She then underwent a robotic assisted total hysterectomy, BSO, sentinel lymph node biopsy and umbilical hernia repair (with Dr Hassell Done) on [id number] without complications.  Her postoperative course was uncomplicated.  Her final pathologic diagnosis is a Stage IA Grade 3 endometrioid endometrial cancer with no lymphovascular space invasion, 0/15 mm (no) myometrial invasion and negative sentinel lymph nodes. Her tumor was large (6cm) and the uterus required intact specimen removal via the hernia site incision.  Interval Hx: She is seen today for a routine surveillance check. She has stable  appetite, normal bowel and bladder function, and no pain or bleeding.   Past Medical History:  Diagnosis Date  . Cancer [facility])    endometrial cancer  . Obesity   . Ventral hernia    Past Surgical History:  Procedure Laterality Date  . laparoscopic surgery for fertility  40 yrs ago  . LYMPH NODE BIOPSY N/A 04/07/2015   Procedure:  SENTINEL LYMPH NODE BIOPSY;  Surgeon: Everitt Amber, MD;  Location:  [facility];  Service: Gynecology;  Laterality: N/A;  . ROBOTIC ASSISTED TOTAL HYSTERECTOMY WITH BILATERAL SALPINGO OOPHERECTOMY Bilateral 04/07/2015   Procedure: XI ROBOTIC ASSISTED TOTAL LAPAROSCOPIC  HYSTERECTOMY WITH BILATERAL SALPINGO OOPHORECTOMY;  Surgeon: Everitt Amber, MD;  Location: [facility];  Service: Gynecology;  Laterality: Bilateral;  . VENTRAL HERNIA REPAIR N/A 04/07/2015   Procedure: LAPAROSCOPIC VENTRAL HERNIA  REPAIR WITH MESH;  Surgeon: Johnathan Hausen, MD;  Location: [facility];  Service: General;  Laterality: N/A;   Family History  Problem Relation Age of Onset  . Cancer Mother   . Hypertension Mother   . Stroke Mother   . Lung disease Neg Hx    Social History   Socioeconomic History  . Marital status: Married    Spouse name: Not on file  . Number of children: Not on file  . Years of education: Not on file  . Highest education level: Not on file  Occupational History  . Occupation: retired  Scientific laboratory technician  . Financial resource strain: Not hard at all  . Food insecurity    Worry: Never true    Inability: Never true  . Transportation needs    Medical: No    Non-medical: No  Tobacco Use  . Smoking status: Never Smoker  . Smokeless tobacco: Never Used  Substance and Sexual Activity  . Alcohol use: No  . Drug use: No  . Sexual activity: Yes    Birth control/protection: None, Post-menopausal  Lifestyle  . Physical activity    Days per week: 5 days  Minutes per session: 30 min  . Stress: Only a little  Relationships  . Social Herbalist on phone: Three times a week    Gets together: Three times a week    Attends religious service: More than 4 times per year    Active member of club or organization: Yes    Attends meetings of clubs or organizations: More than 4 times per year    Relationship status: Married  . Intimate partner violence    Fear of current or ex partner: No    Emotionally abused: No    Physically abused: No    Forced sexual activity: No   Other Topics Concern  . Not on file  Social History Narrative  . Not on file   Current Outpatient Medications on File Prior to Visit  Medication Sig Dispense Refill  . cetirizine (ZYRTEC) 10 MG tablet Take 1 tablet (10 mg total) by mouth daily. (Patient taking differently: Take 10 mg by mouth daily as needed. ) 30 tablet 11  . pantoprazole (PROTONIX) 40 MG tablet Take 1 tablet (40 mg total) by mouth daily. Take 30-60 min before first meal of the day 30 tablet 2   No current facility-administered medications on file prior to visit.    Allergies  Allergen Reactions  . Pyridium [Phenazopyridine Hcl] Other (See Comments)    Made pt feel out of it, room spinning, hallucinations.      Review of systems: Constitutional:  She has no weight gain or weight loss. She has no fever or chills. Eyes: No blurred vision Ears, Nose, Mouth, Throat: No dizziness, headaches or changes in hearing. No mouth sores. Cardiovascular: No chest pain, palpitations or edema. Respiratory:  No shortness of breath, wheezing or cough Gastrointestinal: She has normal bowel movements without diarrhea or constipation. She denies any nausea or vomiting. She denies blood in her stool or heart burn. Genitourinary:  She denies pelvic pain, pelvic pressure or changes in her urinary function. She has no hematuria, dysuria, or incontinence. She has no irregular vaginal bleeding or vaginal discharge Musculoskeletal: Denies muscle weakness or joint pains.  Skin:  She has no skin changes, rashes or itching Neurological:  Denies dizziness or headaches. No neuropathy, no numbness or tingling. Psychiatric:  She denies depression or anxiety. Hematologic/Lymphatic:   No easy bruising or bleeding   Physical Exam: Blood pressure (!) 146/66, pulse 86, temperature 97.7 F (36.5 C), temperature source Oral, resp. rate 18, height 5\' 3"  (1.6 m), weight 255 lb 8 oz (115.9 kg), SpO2 100 %. General: Well dressed, well nourished in no  apparent distress.   HEENT:  Normocephalic and atraumatic, no lesions.  Extraocular muscles intact. Sclerae anicteric. Pupils equal, round, reactive. No mouth sores or ulcers. Thyroid is normal size, not nodular, midline. Abdomen:  Soft, nontender, nondistended.  No palpable masses.  No hepatosplenomegaly.  No ascites. Normal bowel sounds.  No hernias.  Incisions are well healed (especially hernia site) - no recurrent hernia appreciated. Genitourinary: Normal EGBUS  Vaginal cuff intact.  No bleeding or discharge.  No cul de sac fullness. Atrophy of vaginal tissues and some prolapse/enterocele.  Extremities: No cyanosis, clubbing or edema.  No calf tenderness or erythema. No palpable cords. Psychiatric: Mood and affect are appropriate. Neurological: Awake, alert and oriented x 3. Sensation is intact, no neuropathy.  Musculoskeletal: No pain, normal strength and range of motion.  Thereasa Solo, MD

## 2018-09-24 NOTE — Patient Instructions (Signed)
Please notify Dr Denman George at phone number [phone] if you notice vaginal bleeding, new pelvic or abdominal pains, bloating, feeling full easy, or a change in bladder or bowel function.   Please contact Dr Serita Grit office (at [phone]) in January, 2021 to request an appointment with her for March, 2021.

## 2018-12-24 ENCOUNTER — Ambulatory Visit: Payer: BLUE CROSS/BLUE SHIELD | Admitting: Internal Medicine

## 2018-12-31 ENCOUNTER — Ambulatory Visit: Payer: BLUE CROSS/BLUE SHIELD | Admitting: Internal Medicine

## 2019-05-01 ENCOUNTER — Inpatient Hospital Stay: Payer: Self-pay | Admitting: Gynecologic Oncology

## 2019-05-01 DIAGNOSIS — C541 Malignant neoplasm of endometrium: Secondary | ICD-10-CM

## 2019-07-02 ENCOUNTER — Telehealth: Payer: Self-pay | Admitting: *Deleted

## 2019-07-02 NOTE — Telephone Encounter (Signed)
Ashley Flores called to reschedule her appointment. She requests to be rescheduled for late August. Appt. Was rescheduled, and she was given the updated date/time.

## 2019-07-03 ENCOUNTER — Inpatient Hospital Stay: Payer: Self-pay | Admitting: Gynecologic Oncology

## 2019-08-19 ENCOUNTER — Telehealth: Payer: Self-pay | Admitting: *Deleted

## 2019-08-19 NOTE — Telephone Encounter (Signed)
Patient called and canceled her appt for 8/16 and rescheduled to 11/04

## 2019-08-21 ENCOUNTER — Inpatient Hospital Stay: Payer: Self-pay | Admitting: Gynecologic Oncology

## 2019-11-13 ENCOUNTER — Inpatient Hospital Stay: Payer: Medicare HMO | Attending: Gynecologic Oncology | Admitting: Gynecologic Oncology

## 2019-11-13 ENCOUNTER — Encounter: Payer: Self-pay | Admitting: Gynecologic Oncology

## 2019-11-13 ENCOUNTER — Other Ambulatory Visit: Payer: Self-pay

## 2019-11-13 VITALS — BP 150/78 | HR 84 | Temp 97.3°F | Resp 18 | Ht 62.5 in | Wt 250.4 lb

## 2019-11-13 DIAGNOSIS — Z90722 Acquired absence of ovaries, bilateral: Secondary | ICD-10-CM | POA: Diagnosis not present

## 2019-11-13 DIAGNOSIS — Z8542 Personal history of malignant neoplasm of other parts of uterus: Secondary | ICD-10-CM | POA: Diagnosis present

## 2019-11-13 DIAGNOSIS — Z90710 Acquired absence of both cervix and uterus: Secondary | ICD-10-CM | POA: Diagnosis not present

## 2019-11-13 DIAGNOSIS — E669 Obesity, unspecified: Secondary | ICD-10-CM | POA: Insufficient documentation

## 2019-11-13 DIAGNOSIS — Z08 Encounter for follow-up examination after completed treatment for malignant neoplasm: Secondary | ICD-10-CM

## 2019-11-13 DIAGNOSIS — C541 Malignant neoplasm of endometrium: Secondary | ICD-10-CM

## 2019-11-13 NOTE — Progress Notes (Signed)
ENDOMETRIAL CANCER FOLLOW-UP  Assessment:    69 y.o. year old with Stage IA Grade 3 endometrioid endometrial cancer.   S/p robotic assisted total hysterectomy, BSO, bilateral SLN biopsy and umbilical hernia repair on 04/07/15. no LVSI, no myometrial invasion,  Negative pelvic sentinel lymph nodes.  Clinically free of disease on exam.  Plan: We will continue with visits every 6 months x 5 years, then she can return to annual visits in April, 2022.  Discussed signs and symptoms of recurrence including vaginal bleeding or discharge, leg pain or swelling and changes in bowel or bladder habits. She was given the opportunity to ask questions, which were answered to her satisfaction, and she is agreement with the above mentioned plan of care.  Return to clinic in 6 months after which time we will suspend surveillance if disease free.   HPI:  GRACE VALLEY is a 69 y.o. year old G2P2002 initially seen in consultation on 03/04/15 referred by Dr Hulan Fray for grade 3 endometrioid endometrial cancer.  She then underwent a robotic assisted total hysterectomy, BSO, sentinel lymph node biopsy and umbilical hernia repair (with Dr Hassell Done) on 09/12/35 without complications.  Her postoperative course was uncomplicated.  Her final pathologic diagnosis is a Stage IA Grade 3 endometrioid endometrial cancer with no lymphovascular space invasion, 0/15 mm (no) myometrial invasion and negative sentinel lymph nodes. Her tumor was large (6cm) and the uterus required intact specimen removal via the hernia site incision.  Interval Hx: She is seen today for a routine surveillance check. She has stable  appetite, normal bowel and bladder function, and no pain or bleeding.   Past Medical History:  Diagnosis Date   Cancer [facility])    endometrial cancer   Obesity    Ventral hernia    Past Surgical History:  Procedure Laterality Date   laparoscopic surgery for fertility  40 yrs ago   LYMPH NODE BIOPSY N/A 04/07/2015   Procedure:   SENTINEL LYMPH NODE BIOPSY;  Surgeon: Everitt Amber, MD;  Location: [facility];  Service: Gynecology;  Laterality: N/A;   ROBOTIC ASSISTED TOTAL HYSTERECTOMY WITH BILATERAL SALPINGO OOPHERECTOMY Bilateral 04/07/2015   Procedure: XI ROBOTIC ASSISTED TOTAL LAPAROSCOPIC  HYSTERECTOMY WITH BILATERAL SALPINGO OOPHORECTOMY;  Surgeon: Everitt Amber, MD;  Location: [facility];  Service: Gynecology;  Laterality: Bilateral;   VENTRAL HERNIA REPAIR N/A 04/07/2015   Procedure: LAPAROSCOPIC VENTRAL HERNIA  REPAIR WITH MESH;  Surgeon: Johnathan Hausen, MD;  Location: [facility];  Service: General;  Laterality: N/A;   Family History  Problem Relation Age of Onset   Cancer Mother    Hypertension Mother    Stroke Mother    Lung disease Neg Hx    Social History   Socioeconomic History   Marital status: Married    Spouse name: Not on file   Number of children: Not on file   Years of education: Not on file   Highest education level: Not on file  Occupational History   Occupation: retired  Tobacco Use   Smoking status: Never Smoker   Smokeless tobacco: Never Used  Scientific laboratory technician Use: Never used  Substance and Sexual Activity   Alcohol use: No   Drug use: No   Sexual activity: Yes    Birth control/protection: None, Post-menopausal  Other Topics Concern   Not on file  Social History Narrative   Not on file   Social Determinants of Health   Financial Resource Strain:    Difficulty of Paying Living Expenses: Not on  file  Food Insecurity:    Worried About Charity fundraiser in the Last Year: Not on file   [employer] of Food in the Last Year: Not on file  Transportation Needs:    Lack of Transportation (Medical): Not on file   Lack of Transportation (Non-Medical): Not on file  Physical Activity:    Days of Exercise per Week: Not on file   Minutes of Exercise per Session: Not on file  Stress:    Feeling of Stress : Not on file  Social Connections:    Frequency of Communication with  Friends and Family: Not on file   Frequency of Social Gatherings with Friends and Family: Not on file   Attends Religious Services: Not on file   Active Member of Clubs or Organizations: Not on file   Attends Archivist Meetings: Not on file   Marital Status: Not on file  Intimate Partner Violence:    Fear of Current or Ex-Partner: Not on file   Emotionally Abused: Not on file   Physically Abused: Not on file   Sexually Abused: Not on file   Current Outpatient Medications on File Prior to Visit  Medication Sig Dispense Refill   cetirizine (ZYRTEC) 10 MG tablet Take 1 tablet (10 mg total) by mouth daily. (Patient taking differently: Take 10 mg by mouth daily as needed. ) 30 tablet 11   No current facility-administered medications on file prior to visit.   Allergies  Allergen Reactions   Pyridium [Phenazopyridine Hcl] Other (See Comments)    Made pt feel out of it, room spinning, hallucinations.      Review of systems: Constitutional:  She has no weight gain or weight loss. She has no fever or chills. Eyes: No blurred vision Ears, Nose, Mouth, Throat: No dizziness, headaches or changes in hearing. No mouth sores. Cardiovascular: No chest pain, palpitations or edema. Respiratory:  No shortness of breath, wheezing or cough Gastrointestinal: She has normal bowel movements without diarrhea or constipation. She denies any nausea or vomiting. She denies blood in her stool or heart burn. Genitourinary:  She denies pelvic pain, pelvic pressure or changes in her urinary function. She has no hematuria, dysuria, or incontinence. She has no irregular vaginal bleeding or vaginal discharge Musculoskeletal: Denies muscle weakness or joint pains.  Skin:  She has no skin changes, rashes or itching Neurological:  Denies dizziness or headaches. No neuropathy, no numbness or tingling. Psychiatric:  She denies depression or anxiety. Hematologic/Lymphatic:   No easy bruising or  bleeding   Physical Exam: Blood pressure (!) 150/78, pulse 84, temperature (!) 97.3 F (36.3 C), temperature source Tympanic, resp. rate 18, height 5' 2.5" (1.588 m), weight 250 lb 6.4 oz (113.6 kg), SpO2 97 %. General: Well dressed, well nourished in no apparent distress.   HEENT:  Normocephalic and atraumatic, no lesions.  Extraocular muscles intact. Sclerae anicteric. Pupils equal, round, reactive. No mouth sores or ulcers. Thyroid is normal size, not nodular, midline. Abdomen:  Soft, nontender, nondistended.  No palpable masses.  No hepatosplenomegaly.  No ascites. Normal bowel sounds.  No hernias.  Incisions are well healed (especially hernia site) - no recurrent hernia appreciated. Genitourinary: Normal EGBUS  Vaginal cuff intact.  No bleeding or discharge.  No cul de sac fullness. Atrophy of vaginal tissues and some prolapse/enterocele.  Extremities: No cyanosis, clubbing or edema.  No calf tenderness or erythema. No palpable cords. Psychiatric: Mood and affect are appropriate. Neurological: Awake, alert and oriented x 3.  Sensation is intact, no neuropathy.  Musculoskeletal: No pain, normal strength and range of motion.  Thereasa Solo, MD

## 2019-11-13 NOTE — Patient Instructions (Signed)
Please notify Dr Denman George at phone number [phone] if you notice vaginal bleeding, new pelvic or abdominal pains, bloating, feeling full easy, or a change in bladder or bowel function.    Please contact Dr Serita Grit office (at [phone]) in January to request an appointment with her for April, 2022.

## 2020-02-11 ENCOUNTER — Telehealth: Payer: Self-pay | Admitting: *Deleted

## 2020-02-11 NOTE — Telephone Encounter (Signed)
Patient called and scheduled a follow up appt for April 26 at 1:30 pm

## 2020-04-27 ENCOUNTER — Encounter: Payer: Self-pay | Admitting: Gynecologic Oncology

## 2020-04-28 ENCOUNTER — Other Ambulatory Visit: Payer: Self-pay

## 2020-04-28 ENCOUNTER — Encounter: Payer: Self-pay | Admitting: Gynecologic Oncology

## 2020-04-28 ENCOUNTER — Inpatient Hospital Stay: Payer: Medicare HMO | Attending: Gynecologic Oncology | Admitting: Gynecologic Oncology

## 2020-04-28 VITALS — BP 170/83 | HR 83 | Temp 97.9°F | Resp 16 | Ht 62.5 in | Wt 258.0 lb

## 2020-04-28 DIAGNOSIS — Z90722 Acquired absence of ovaries, bilateral: Secondary | ICD-10-CM | POA: Diagnosis not present

## 2020-04-28 DIAGNOSIS — C541 Malignant neoplasm of endometrium: Secondary | ICD-10-CM | POA: Diagnosis not present

## 2020-04-28 DIAGNOSIS — Z90710 Acquired absence of both cervix and uterus: Secondary | ICD-10-CM | POA: Insufficient documentation

## 2020-04-28 NOTE — Progress Notes (Signed)
ENDOMETRIAL CANCER FOLLOW-UP  Assessment:    70 y.o. year old with Stage IA Grade 3 endometrioid endometrial cancer.   S/p robotic assisted total hysterectomy, BSO, bilateral SLN biopsy and umbilical hernia repair on 04/07/15. no LVSI, no myometrial invasion,  Negative pelvic sentinel lymph nodes.  Clinically free of disease on exam.  Plan:  We will suspend surveillance as she is disease free.   HPI:  Ashley Flores is a 70 y.o. year old G2P2002 initially seen in consultation on 03/04/15 referred by Dr Hulan Fray for grade 3 endometrioid endometrial cancer.  She then underwent a robotic assisted total hysterectomy, BSO, sentinel lymph node biopsy and umbilical hernia repair (with Dr Hassell Done) on 02/10/04 without complications.  Her postoperative course was uncomplicated.  Her final pathologic diagnosis is a Stage IA Grade 3 endometrioid endometrial cancer with no lymphovascular space invasion, 0/15 mm (no) myometrial invasion and negative sentinel lymph nodes. Her tumor was large (6cm) and the uterus required intact specimen removal via the hernia site incision.  Interval Hx: She is seen today for a routine surveillance check. She has stable  appetite, normal bowel and bladder function, and no pain or bleeding.   Past Medical History:  Diagnosis Date  . Cancer [facility])    endometrial cancer  . Obesity   . Ventral hernia    Past Surgical History:  Procedure Laterality Date  . laparoscopic surgery for fertility  40 yrs ago  . LYMPH NODE BIOPSY N/A 04/07/2015   Procedure:  SENTINEL LYMPH NODE BIOPSY;  Surgeon: Everitt Amber, MD;  [facility]: [facility];  Service: Gynecology;  Laterality: N/A;  . ROBOTIC ASSISTED TOTAL HYSTERECTOMY WITH BILATERAL SALPINGO OOPHERECTOMY Bilateral 04/07/2015   Procedure: XI ROBOTIC ASSISTED TOTAL LAPAROSCOPIC  HYSTERECTOMY WITH BILATERAL SALPINGO OOPHORECTOMY;  Surgeon: Everitt Amber, MD;  [facility]: [facility];  Service: Gynecology;  Laterality: Bilateral;  . VENTRAL HERNIA REPAIR N/A 04/07/2015    Procedure: LAPAROSCOPIC VENTRAL HERNIA  REPAIR WITH MESH;  Surgeon: Johnathan Hausen, MD;  [facility];  Service: General;  Laterality: N/A;   Family History  Problem Relation Age of Onset  . Cancer Mother   . Hypertension Mother   . Stroke Mother   . Lung disease Neg Hx    Social History   Socioeconomic History  . Marital status: Married    Spouse name: Not on file  . Number of children: Not on file  . Years of education: Not on file  . Highest education level: Not on file  Occupational History  . Occupation: retired  Tobacco Use  . Smoking status: Never Smoker  . Smokeless tobacco: Never Used  Vaping Use  . Vaping Use: Never used  Substance and Sexual Activity  . Alcohol use: No  . Drug use: No  . Sexual activity: Yes    Birth control/protection: None, Post-menopausal  Other Topics Concern  . Not on file  Social History Narrative  . Not on file   Social Determinants of Health   Financial Resource Strain: Not on file  Food Insecurity: Not on file  Transportation Needs: Not on file  Physical Activity: Not on file  Stress: Not on file  Social Connections: Not on file  Intimate Partner Violence: Not on file   Current Outpatient Medications on File Prior to Visit  Medication Sig Dispense Refill  . cetirizine (ZYRTEC) 10 MG tablet Take 1 tablet (10 mg total) by mouth daily. (Patient taking differently: Take 10 mg by mouth daily as needed. ) 30 tablet 11  No current facility-administered medications on file prior to visit.   Allergies  Allergen Reactions  . Pyridium [Phenazopyridine Hcl] Other (See Comments)    Made pt feel out of it, room spinning, hallucinations.      Review of systems: Constitutional:  She has no weight gain or weight loss. She has no fever or chills. Eyes: No blurred vision Ears, Nose, Mouth, Throat: No dizziness, headaches or changes in hearing. No mouth sores. Cardiovascular: No chest pain, palpitations or edema. Respiratory:  No  shortness of breath, wheezing or cough Gastrointestinal: She has normal bowel movements without diarrhea or constipation. She denies any nausea or vomiting. She denies blood in her stool or heart burn. Genitourinary:  She denies pelvic pain, pelvic pressure or changes in her urinary function. She has no hematuria, dysuria, or incontinence. She has no irregular vaginal bleeding or vaginal discharge Musculoskeletal: Denies muscle weakness or joint pains.  Skin:  She has no skin changes, rashes or itching Neurological:  Denies dizziness or headaches. No neuropathy, no numbness or tingling. Psychiatric:  She denies depression or anxiety. Hematologic/Lymphatic:   No easy bruising or bleeding   Physical Exam: Blood pressure (!) 170/83, pulse 83, temperature 97.9 F (36.6 C), temperature source Tympanic, resp. rate 16, height 5' 2.5" (1.588 m), weight 258 lb (117 kg), SpO2 96 %. General: Well dressed, well nourished in no apparent distress.   HEENT:  Normocephalic and atraumatic, no lesions.  Extraocular muscles intact. Sclerae anicteric. Pupils equal, round, reactive. No mouth sores or ulcers. Thyroid is normal size, not nodular, midline. Abdomen:  Soft, nontender, nondistended.  No palpable masses.  No hepatosplenomegaly.  No ascites. Normal bowel sounds.  No hernias.  Incisions are well healed (especially hernia site) - no recurrent hernia appreciated. Genitourinary: Normal EGBUS  Vaginal cuff intact.  No bleeding or discharge.  No cul de sac fullness. Atrophy of vaginal tissues and some prolapse/enterocele.  Extremities: No cyanosis, clubbing or edema.  No calf tenderness or erythema. No palpable cords. Psychiatric: Mood and affect are appropriate. Neurological: Awake, alert and oriented x 3. Sensation is intact, no neuropathy.  Musculoskeletal: No pain, normal strength and range of motion.  Thereasa Solo, MD

## 2020-04-28 NOTE — Patient Instructions (Signed)
Please notify Dr Denman George at phone number [phone] if you notice vaginal bleeding, new pelvic or abdominal pains, bloating, feeling full easy, or a change in bladder or bowel function.    As you have reached 5 years since treatment of your cancer, you no longer require scheduled follow-up with Dr Denman George.

## 2020-08-07 ENCOUNTER — Telehealth: Payer: Self-pay

## 2020-08-07 NOTE — Telephone Encounter (Signed)
Received phone call from patient inquiring about her surgery on 04/07/2015. Patient wondering if she still has a cervix. Pt. Denied any new symptoms or vaginal bleeding. Per OP note from surgery cervix was removed during procedure. Patient verbalized understanding. She will call if she has any questions or concerns.

## 2020-09-24 ENCOUNTER — Other Ambulatory Visit: Payer: Self-pay

## 2020-09-24 ENCOUNTER — Encounter (HOSPITAL_COMMUNITY): Payer: Self-pay

## 2020-09-24 ENCOUNTER — Emergency Department (HOSPITAL_COMMUNITY): Payer: Medicare HMO

## 2020-09-24 ENCOUNTER — Emergency Department (HOSPITAL_COMMUNITY)
Admission: EM | Admit: 2020-09-24 | Discharge: 2020-09-25 | Disposition: A | Discharge status: Home / Self Care | Payer: Medicare HMO | Attending: Emergency Medicine | Admitting: Emergency Medicine

## 2020-09-24 DIAGNOSIS — R059 Cough, unspecified: Secondary | ICD-10-CM | POA: Diagnosis not present

## 2020-09-24 DIAGNOSIS — R062 Wheezing: Secondary | ICD-10-CM | POA: Insufficient documentation

## 2020-09-24 DIAGNOSIS — Z8542 Personal history of malignant neoplasm of other parts of uterus: Secondary | ICD-10-CM | POA: Insufficient documentation

## 2020-09-24 DIAGNOSIS — J45909 Unspecified asthma, uncomplicated: Secondary | ICD-10-CM | POA: Insufficient documentation

## 2020-09-24 DIAGNOSIS — Z743 Need for continuous supervision: Secondary | ICD-10-CM | POA: Diagnosis not present

## 2020-09-24 DIAGNOSIS — R0682 Tachypnea, not elsewhere classified: Secondary | ICD-10-CM | POA: Diagnosis not present

## 2020-09-24 DIAGNOSIS — I10 Essential (primary) hypertension: Secondary | ICD-10-CM | POA: Diagnosis not present

## 2020-09-24 DIAGNOSIS — R0602 Shortness of breath: Secondary | ICD-10-CM | POA: Insufficient documentation

## 2020-09-24 DIAGNOSIS — R0902 Hypoxemia: Secondary | ICD-10-CM | POA: Diagnosis not present

## 2020-09-24 LAB — COMPREHENSIVE METABOLIC PANEL
ALT: 23 U/L (ref 0–44)
AST: 27 U/L (ref 15–41)
Albumin: 4.2 g/dL (ref 3.5–5.0)
Alkaline Phosphatase: 113 U/L (ref 38–126)
Anion gap: 14 (ref 5–15)
BUN: 12 mg/dL (ref 8–23)
CO2: 25 mmol/L (ref 22–32)
Calcium: 9.1 mg/dL (ref 8.9–10.3)
Chloride: 97 mmol/L — ABNORMAL LOW (ref 98–111)
Creatinine, Ser: 0.92 mg/dL (ref 0.44–1.00)
GFR, Estimated: >60 mL/min (ref >60)
Glucose, Bld: 177 mg/dL — ABNORMAL HIGH (ref 70–99)
Potassium: 3.6 mmol/L (ref 3.5–5.1)
Sodium: 136 mmol/L (ref 135–145)
Total Bilirubin: 1.7 mg/dL — ABNORMAL HIGH (ref 0.3–1.2)
Total Protein: 7.8 g/dL (ref 6.5–8.1)

## 2020-09-24 LAB — CBC WITH DIFFERENTIAL/PLATELET
Abs Immature Granulocytes: 0.21 10*3/uL — ABNORMAL HIGH (ref 0.00–0.07)
Basophils Absolute: 0.1 10*3/uL (ref 0.0–0.1)
Basophils Relative: 1 %
Eosinophils Absolute: 0.6 10*3/uL — ABNORMAL HIGH (ref 0.0–0.5)
Eosinophils Relative: 6 %
HCT: 47.4 % — ABNORMAL HIGH (ref 36.0–46.0)
Hemoglobin: 15.5 g/dL — ABNORMAL HIGH (ref 12.0–15.0)
Immature Granulocytes: 2 %
Lymphocytes Relative: 16 %
Lymphs Abs: 1.8 10*3/uL (ref 0.7–4.0)
MCH: 29.2 pg (ref 26.0–34.0)
MCHC: 32.7 g/dL (ref 30.0–36.0)
MCV: 89.3 fL (ref 80.0–100.0)
Monocytes Absolute: 0.7 10*3/uL (ref 0.1–1.0)
Monocytes Relative: 6 %
Neutro Abs: 7.6 10*3/uL (ref 1.7–7.7)
Neutrophils Relative %: 69 %
Platelets: 233 10*3/uL (ref 150–400)
RBC: 5.31 MIL/uL — ABNORMAL HIGH (ref 3.87–5.11)
RDW: 14.3 % (ref 11.5–15.5)
WBC: 10.9 10*3/uL — ABNORMAL HIGH (ref 4.0–10.5)
nRBC: 0 % (ref 0.0–0.2)

## 2020-09-24 LAB — TROPONIN I (HIGH SENSITIVITY): Troponin I (High Sensitivity): 4 ng/L (ref <18)

## 2020-09-24 LAB — BRAIN NATRIURETIC PEPTIDE: B Natriuretic Peptide: 21 pg/mL (ref 0.0–100.0)

## 2020-09-24 LAB — D-DIMER, QUANTITATIVE: D-Dimer, Quant: 0.5 ug/mL-FEU (ref 0.00–0.50)

## 2020-09-24 MED ORDER — IPRATROPIUM BROMIDE 0.02 % IN SOLN
0.5000 mg | Freq: Once | RESPIRATORY_TRACT | Status: AC
  Administered 2020-09-24: 0.5 mg via RESPIRATORY_TRACT
  Filled 2020-09-24: qty 2.5

## 2020-09-24 MED ORDER — MAGNESIUM SULFATE 2 GM/50ML IV SOLN
2.0000 g | Freq: Once | INTRAVENOUS | Status: AC
  Administered 2020-09-24: 2 g via INTRAVENOUS
  Filled 2020-09-24: qty 50

## 2020-09-24 MED ORDER — ALBUTEROL SULFATE (2.5 MG/3ML) 0.083% IN NEBU
5.0000 mg | INHALATION_SOLUTION | Freq: Once | RESPIRATORY_TRACT | Status: AC
  Administered 2020-09-24: 5 mg via RESPIRATORY_TRACT
  Filled 2020-09-24: qty 6

## 2020-09-24 NOTE — ED Notes (Addendum)
Patient refusing Covid swab. Patient said she does not want anything stuck up her nose. Dr. Maryan Rued notified.

## 2020-09-24 NOTE — ED Provider Notes (Signed)
[facility] DEPT Provider Note   CSN: [id number] Arrival date & time: 09/24/20  2051     History Chief Complaint  Patient presents with   Shortness of Breath    Ashley Flores is a 70 y.o. female.  Patient is a 70 year old female with a history of prior endometrial cancer not currently requiring any treatments, prior asthma versus UACS who is presenting today with complaints of shortness of breath.  Patient has noted for the last 4 days she has felt chest congestion with a cough productive only of clear sputum.  She has not had any nasal congestion or fever.  She has used her albuterol inhaler at home occasionally with improvement.  She last used it this morning but this evening was getting a lot of congestion and her children were concerned and called 911.  She denies any chest pain, unilateral leg pain or swelling.  She has not noticed any swelling in her abdomen or legs.  No prior heart history.  She did start taking Mucinex today.  No known sick contacts.  When paramedics arrived patient oxygen saturation was 87% on room air.  She was given albuterol and oxygen improved to 98% with improvement in wheezing but then upon arrival to the emergency room she was 89% on room air.  She did receive Solu-Medrol in route and a total of 10 mg of albuterol.  She reports currently she feels okay but does feel a little shaky.  Denying shortness of breath at this moment.  She is not sure if exertion made her shortness of breath worse but has had similar symptoms in the past.   Shortness of Breath     Past Medical History:  Diagnosis Date   Cancer [facility])    endometrial cancer   Obesity    Ventral hernia     Patient Active Problem List   Diagnosis Date Noted   Cough variant asthma vs UACS 05/31/2018   Viral URI with cough 11/28/2017   Non-seasonal allergic rhinitis due to pollen 11/28/2017   Endometrial cancer ([place]) 13/08/6576   Umbilical hernia, incarcerated  04/07/2015   Morbid (severe) obesity due to excess calories [facility])     Past Surgical History:  Procedure Laterality Date   laparoscopic surgery for fertility  40 yrs ago   [place] N/A 04/07/2015   Procedure:  SENTINEL LYMPH NODE BIOPSY;  Surgeon: Everitt Amber, MD;  Location: [facility];  Service: Gynecology;  Laterality: N/A;   ROBOTIC ASSISTED TOTAL HYSTERECTOMY WITH BILATERAL SALPINGO OOPHERECTOMY Bilateral 04/07/2015   Procedure: XI ROBOTIC ASSISTED TOTAL LAPAROSCOPIC  HYSTERECTOMY WITH BILATERAL SALPINGO OOPHORECTOMY;  Surgeon: Everitt Amber, MD;  Location: [facility];  Service: Gynecology;  Laterality: Bilateral;   VENTRAL HERNIA REPAIR N/A 04/07/2015   Procedure: LAPAROSCOPIC VENTRAL HERNIA  REPAIR WITH MESH;  Surgeon: Johnathan Hausen, MD;  [facility]: [facility];  Service: General;  Laterality: N/A;     OB History     Gravida  2   Para  2   Term  2   Preterm      AB      Living  2      SAB      IAB      Ectopic      Multiple      Live Births  2           Family History  Problem Relation Age of Onset   Cancer Mother    Hypertension Mother  Stroke Mother    Lung disease Neg Hx     Social History   Tobacco Use   Smoking status: Never   Smokeless tobacco: Never  Vaping Use   Vaping Use: Never used  Substance Use Topics   Alcohol use: No   Drug use: No    Home Medications Prior to Admission medications   Medication Sig Start Date End Date Taking? Authorizing Provider  cetirizine (ZYRTEC) 10 MG tablet Take 1 tablet (10 mg total) by mouth daily. Patient taking differently: Take 10 mg by mouth daily as needed.  11/28/17   Forrest Moron, MD    Allergies    Pyridium [phenazopyridine hcl]  Review of Systems   Review of Systems  Respiratory:  Positive for shortness of breath.   All other systems reviewed and are negative.  Physical Exam Updated Vital Signs BP (!) 130/97 (BP Location: Right Arm)   Pulse 100   Temp 98.4 F (36.9 C) (Oral)   Resp (!)  22   Ht 5' 2.5" (1.588 m)   Wt 108.9 kg   SpO2 92%   BMI 43.20 kg/m   Physical Exam Vitals and nursing note reviewed.  Constitutional:      General: She is not in acute distress.    Appearance: She is well-developed.  HENT:     Head: Normocephalic and atraumatic.  Eyes:     Pupils: Pupils are equal, round, and reactive to light.  Cardiovascular:     Rate and Rhythm: Normal rate and regular rhythm.     Pulses: Normal pulses.     Heart sounds: Normal heart sounds. No murmur heard.   No friction rub.  Pulmonary:     Effort: Pulmonary effort is normal. Tachypnea present.     Breath sounds: Wheezing present. No rales.     Comments: Speaking in full sentences Abdominal:     General: Bowel sounds are normal. There is no distension.     Palpations: Abdomen is soft.     Tenderness: There is no abdominal tenderness. There is no guarding or rebound.  Musculoskeletal:        General: No tenderness. Normal range of motion.     Cervical back: Normal range of motion and neck supple.     Right lower leg: No tenderness. No edema.     Left lower leg: No tenderness. No edema.     Comments: No edema  Skin:    General: Skin is warm and dry.     Findings: No rash.  Neurological:     Mental Status: She is alert and oriented to person, place, and time. Mental status is at baseline.     Cranial Nerves: No cranial nerve deficit.  Psychiatric:        Mood and Affect: Mood normal.        Behavior: Behavior normal.    ED Results / Procedures / Treatments   Labs (all labs ordered are listed, but only abnormal results are displayed) Labs Reviewed  CBC WITH DIFFERENTIAL/PLATELET - Abnormal; Notable for the following components:      Result Value   WBC 10.9 (*)    RBC 5.31 (*)    Hemoglobin 15.5 (*)    HCT 47.4 (*)    Eosinophils Absolute 0.6 (*)    Abs Immature Granulocytes 0.21 (*)    All other components within normal limits  RESP PANEL BY RT-PCR (FLU A&B, COVID) ARPGX2  BRAIN  NATRIURETIC PEPTIDE  D-DIMER, QUANTITATIVE  COMPREHENSIVE METABOLIC  PANEL  TROPONIN I (HIGH SENSITIVITY)    EKG None  Radiology DG Chest Port 1 View  Result Date: 09/24/2020 CLINICAL DATA:  Productive cough and shortness of breath x4 days. EXAM: PORTABLE CHEST 1 VIEW COMPARISON:  May 31, 2018 FINDINGS: There is limited evaluation of the bilateral apices secondary to positioning of the patient's head and neck. Mild, diffuse, chronic appearing increased interstitial lung markings are seen. Mild to moderate severity areas of atelectasis and/or infiltrate are seen within the bilateral lung bases. There is no evidence of a pleural effusion or pneumothorax. The heart size and mediastinal contours are within normal limits. The visualized skeletal structures are unremarkable. IMPRESSION: Chronic appearing increased interstitial lung markings with mild to moderate severity bibasilar atelectasis and/or infiltrate. Electronically Signed   By: Virgina Norfolk M.D.   On: 09/24/2020 21:20    Procedures Procedures   Medications Ordered in ED Medications  magnesium sulfate IVPB 2 g 50 mL (2 g Intravenous New Bag/Given 09/24/20 2151)  albuterol (PROVENTIL) (2.5 MG/3ML) 0.083% nebulizer solution 5 mg (5 mg Nebulization Given 09/24/20 2151)  ipratropium (ATROVENT) nebulizer solution 0.5 mg (0.5 mg Nebulization Given 09/24/20 2151)    ED Course  I have reviewed the triage vital signs and the nursing notes.  Pertinent labs & imaging results that were available during my care of the patient were reviewed by me and considered in my medical decision making (see chart for details).    MDM Rules/Calculators/A&P                           70 year old female presenting today with hypoxic respiratory failure and wheezing.  She is still wheezing diffusely on exam and requiring 2 L of oxygen to maintain sats in the low 90s.  She denies any fever and lower suspicion for pneumonia at this time.  Patient currently is  refusing COVID testing.  She does not have any evidence of fluid overload has no chest pain and no prior history of PE or DVT.  Lower suspicion for that today.  EKG is within normal limits.  Chest x-ray shows chronic appearing increased interstitial lung markings with mild to moderate bibasilar atelectasis or infiltrate.  D-dimer is within normal limits.  CBC with minimal leukocytosis of 10.9, hemoglobin of 15 but no other acute findings. BNP wnl. Patient is still diffusely wheezing we will give magnesium, albuterol and Atrovent and recheck.  After initial nebs int he department pt wheezing is improved.  She is still requiring O2 to maintain sats of 92%.  Will continue to monitor pt.  She does not desire to be admitted but will need ongoing observation to see if the O2 can be weaned off.    MDM   Amount and/or Complexity of Data Reviewed Clinical lab tests: ordered and reviewed Tests in the radiology section of CPT: reviewed and ordered Tests in the medicine section of CPT: ordered and reviewed Independent visualization of images, tracings, or specimens: yes      Final Clinical Impression(s) / ED Diagnoses Final diagnoses:  None    Rx / DC Orders ED Discharge Orders     None        Blanchie Dessert, MD 09/25/20 1308

## 2020-09-24 NOTE — ED Triage Notes (Signed)
Patient BIB [facility] from home. Shortness of breath last 4 days with a productive cough. Light yellow/clear sputum. No fever, diarrhea, vomiting.   EMS vitals 87% room air 5mg  albuterol on site and sats shot up to 98% wheezing decreased, then sats dropped again on room air to 89%. 125mg  solumedrol IV .5mg  atrovent and 2nd breathing treatment Total 10mg  albuterol 20G left hand

## 2020-09-24 NOTE — ED Notes (Signed)
Patient ambulated without oxygen and saturations remained above 91% room air. Patient coughed in between but was able to catch her breath.

## 2020-09-25 MED ORDER — PREDNISONE 20 MG PO TABS: 20.0000 mg | ORAL_TABLET | Freq: Every day | ORAL | 0 refills | Status: AC

## 2020-09-25 MED ORDER — AZITHROMYCIN 250 MG PO TABS: 250.0000 mg | ORAL_TABLET | Freq: Every day | ORAL | 0 refills | Status: DC

## 2020-09-25 MED ORDER — ALBUTEROL SULFATE HFA 108 (90 BASE) MCG/ACT IN AERS: 1.0000 | INHALATION_SPRAY | Freq: Four times a day (QID) | RESPIRATORY_TRACT | 0 refills | Status: AC | PRN

## 2020-09-25 MED ORDER — ALBUTEROL SULFATE HFA 108 (90 BASE) MCG/ACT IN AERS
2.0000 | INHALATION_SPRAY | Freq: Once | RESPIRATORY_TRACT | Status: AC
  Administered 2020-09-25: 2 via RESPIRATORY_TRACT
  Filled 2020-09-25: qty 6.7

## 2020-09-25 NOTE — Discharge Instructions (Signed)
You were seen in the emergency room today with trouble breathing and wheezing.  Your oxygen saturations were low but we were able to give treatments and take you off of oxygen.  We discussed potentially staying in the hospital for 24 hours to continue treatments but I do think it is reasonable to monitor symptoms closely at home.  You can buy an over-the-counter oxygen monitor from the pharmacy.  I have called in several medications to your pharmacy to take as directed.  Please establish care with a primary care doctor who can continue to follow your symptoms and make referrals as needed.  If you develop worsening shortness of breath or noticed that your oxygen drops below 90% regularly you should be re-evaluated in the ED immediately.

## 2020-09-25 NOTE — ED Provider Notes (Signed)
Blood pressure 117/70, pulse 80, temperature 98.4 F (36.9 C), temperature source Oral, resp. rate 14, height 5' 2.5" (1.588 m), weight 108.9 kg, SpO2 92 %.  Assuming care from Dr. Maryan Rued.  In short, Ashley Flores is a 70 y.o. female with a chief complaint of Shortness of Breath .  Refer to the original H&P for additional details.  The current plan of care is to follow up after additional treatment and ambulation.  01:11 AM  Reevaluated after breathing treatments and labs resulting.  Her work of breathing is not significant.  She has some mild wheezing which seems less than before.  She has been able to come off of the supplemental oxygen but continues to have oxygen saturations in the low 90s.  She was able to ambulate without significant difficulty and similar oxygen saturations in the low 90s.  We discussed admission for continued treatments but patient is feeling improved and has been able to come off of oxygen.  We discussed that this can worsen but she would like to try treatment at home. Albuterol inh given in the ED for the patient to use at home. Will send Rx for steroid, Z-pack, and husband to by O2 sensor for home use. Discussed strict ED return precautions. Patient refusing COVID swab here.     Margette Fast, MD 09/25/20 [phone]

## 2021-03-30 DIAGNOSIS — L814 Other melanin hyperpigmentation: Secondary | ICD-10-CM | POA: Diagnosis not present

## 2021-03-30 DIAGNOSIS — Z85828 Personal history of other malignant neoplasm of skin: Secondary | ICD-10-CM | POA: Diagnosis not present

## 2021-03-30 DIAGNOSIS — C44622 Squamous cell carcinoma of skin of right upper limb, including shoulder: Secondary | ICD-10-CM | POA: Diagnosis not present

## 2021-03-30 DIAGNOSIS — L821 Other seborrheic keratosis: Secondary | ICD-10-CM | POA: Diagnosis not present

## 2021-05-11 DIAGNOSIS — H1032 Unspecified acute conjunctivitis, left eye: Secondary | ICD-10-CM | POA: Diagnosis not present

## 2021-08-01 DIAGNOSIS — A499 Bacterial infection, unspecified: Secondary | ICD-10-CM | POA: Diagnosis not present

## 2021-12-26 DIAGNOSIS — Z0001 Encounter for general adult medical examination with abnormal findings: Secondary | ICD-10-CM | POA: Diagnosis not present

## 2022-05-06 DIAGNOSIS — H9202 Otalgia, left ear: Secondary | ICD-10-CM | POA: Diagnosis not present

## 2022-09-15 DIAGNOSIS — R062 Wheezing: Secondary | ICD-10-CM | POA: Diagnosis not present

## 2022-12-05 ENCOUNTER — Ambulatory Visit: Payer: Medicare HMO

## 2022-12-05 ENCOUNTER — Ambulatory Visit
Admission: EM | Admit: 2022-12-05 | Discharge: 2022-12-05 | Disposition: A | Discharge status: Home / Self Care | Payer: Medicare HMO | Attending: Nurse Practitioner | Admitting: Nurse Practitioner

## 2022-12-05 DIAGNOSIS — R059 Cough, unspecified: Secondary | ICD-10-CM | POA: Diagnosis not present

## 2022-12-05 DIAGNOSIS — J42 Unspecified chronic bronchitis: Secondary | ICD-10-CM

## 2022-12-05 DIAGNOSIS — J40 Bronchitis, not specified as acute or chronic: Secondary | ICD-10-CM | POA: Diagnosis not present

## 2022-12-05 DIAGNOSIS — R062 Wheezing: Secondary | ICD-10-CM | POA: Diagnosis not present

## 2022-12-05 DIAGNOSIS — J209 Acute bronchitis, unspecified: Secondary | ICD-10-CM | POA: Diagnosis not present

## 2022-12-05 MED ORDER — METHYLPREDNISOLONE ACETATE 40 MG/ML IJ SUSP
40.0000 mg | Freq: Once | INTRAMUSCULAR | Status: AC
  Administered 2022-12-05: 40 mg via INTRAMUSCULAR

## 2022-12-05 MED ORDER — PREDNISONE 20 MG PO TABS: 40.0000 mg | ORAL_TABLET | Freq: Every day | ORAL | 0 refills | Status: AC

## 2022-12-05 MED ORDER — AZITHROMYCIN 250 MG PO TABS: 250.0000 mg | ORAL_TABLET | Freq: Every day | ORAL | 0 refills | Status: DC

## 2022-12-05 NOTE — ED Provider Notes (Signed)
[facility]    CSN: [id number] Arrival date & time: 12/05/22  1857      History   Chief Complaint Chief Complaint  Patient presents with  . Shortness of Breath  . Cough    HPI Ashley Flores is a 72 y.o. female.      UPPER RESPIRATORY TRACT INFECTION Onset: 2 days COVID-19 testing history: COVID-19 vaccination status: Fever: no Body aches: yes Chills: {Blank single:19197::"yes","no"} Congested cough: yes; Dry cough: {Blank single:19197::"yes","no"} Shortness of breath: {Blank single:19197::"yes","no"} Wheezing: yes Chest pain: {Blank single:19197::"yes","no", "yes, with cough"} Chest tightness: yes; better after inahler Chest congestion: {Blank single:19197::"yes","no"} Nasal congestion: yes Runny nose: yes Post nasal drip: {Blank single:19197::"yes","no"} Sore throat: no Sinus pressure: {Blank single:19197::"yes","no"} Headache: yes Ear pain: no  Abdominal pain: no Nausea: yes  Vomiting: yes; from  Diarrhea: no  Decreased appetite: yes; fluids okay Loss of taste/smell: {Blank single:19197::"yes","no"}  Rash: {Blank single:19197::"yes","no"} Fatigue: yes Sick contacts: {Blank single:19197::"yes","no"} Treatments attempted: albuterol, ibuprofen helps temp Relief with OTC medications:   Baseline O2 at home 93-95%  History of COPD per daughter  Ems called to house, patient declines hospital   Past Medical History:  Diagnosis Date  . Cancer [facility])    endometrial cancer  . Obesity   . Ventral hernia     Patient Active Problem List   Diagnosis Date Noted  . Cough variant asthma vs UACS 05/31/2018  . Viral URI with cough 11/28/2017  . Non-seasonal allergic rhinitis due to pollen 11/28/2017  . Endometrial cancer (HCC) 04/07/2015  . Umbilical hernia, incarcerated 04/07/2015  . Morbid (severe) obesity due to excess calories [facility])     Past Surgical History:  Procedure Laterality Date  . laparoscopic surgery for fertility  40 yrs ago   . LYMPH NODE BIOPSY N/A 04/07/2015   Procedure:  SENTINEL LYMPH NODE BIOPSY;  Surgeon: Adolphus Birchwood, MD;  Location: [facility];  Service: Gynecology;  Laterality: N/A;  . ROBOTIC ASSISTED TOTAL HYSTERECTOMY WITH BILATERAL SALPINGO OOPHERECTOMY Bilateral 04/07/2015   Procedure: XI ROBOTIC ASSISTED TOTAL LAPAROSCOPIC  HYSTERECTOMY WITH BILATERAL SALPINGO OOPHORECTOMY;  Surgeon: Adolphus Birchwood, MD;  Location: [facility];  Service: Gynecology;  Laterality: Bilateral;  . VENTRAL HERNIA REPAIR N/A 04/07/2015   Procedure: LAPAROSCOPIC VENTRAL HERNIA  REPAIR WITH MESH;  Surgeon: Luretha Murphy, MD;  Location: [facility];  Service: General;  Laterality: N/A;    OB History     Gravida  2   Para  2   Term  2   Preterm      AB      Living  2      SAB      IAB      Ectopic      Multiple      Live Births  2            Home Medications    Prior to Admission medications   Medication Sig Start Date End Date Taking? Authorizing Provider  albuterol (VENTOLIN HFA) 108 (90 Base) MCG/ACT inhaler Inhale 1-2 puffs into the lungs every 6 (six) hours as needed for wheezing or shortness of breath. 09/25/20  Yes Long, Arlyss Repress, MD  azithromycin (ZITHROMAX) 250 MG tablet Take 1 tablet (250 mg total) by mouth daily. Take first 2 tablets together, then 1 every day until finished. Patient not taking: Reported on 12/05/2022 09/25/20   Maia Plan, MD  cetirizine (ZYRTEC) 10 MG tablet Take 1 tablet (10 mg total) by mouth daily. Patient taking differently: Take  10 mg by mouth daily as needed.  11/28/17   Doristine Bosworth, MD    Family History Family History  Problem Relation Age of Onset  . Cancer Mother   . Hypertension Mother   . Stroke Mother   . Lung disease Neg Hx     Social History Social History   Tobacco Use  . Smoking status: Never  . Smokeless tobacco: Never  Vaping Use  . Vaping status: Never Used  Substance Use Topics  . Alcohol use: No  . Drug use: No     Allergies   Pyridium  [phenazopyridine hcl]   Review of Systems Review of Systems Per HPI  Physical Exam Triage Vital Signs ED Triage Vitals  Encounter Vitals Group     BP 12/05/22 1919 (!) 172/70     Systolic BP Percentile --      Diastolic BP Percentile --      Pulse Rate 12/05/22 1919 73     Resp 12/05/22 1919 20     Temp 12/05/22 1917 99.2 F (37.3 C)     Temp Source 12/05/22 1917 Oral     SpO2 12/05/22 1919 90 %     Weight --      Height --      Head Circumference --      Peak Flow --      Pain Score 12/05/22 1919 5     Pain Loc --      Pain Education --      Exclude from Growth Chart --    No data found.  Updated Vital Signs BP (!) 172/70 (BP Location: Right Arm)   Pulse 73   Temp 99.2 F (37.3 C) (Oral)   Resp 20   SpO2 90%   SpO2 92% on recheck  Visual Acuity Right Eye Distance:   Left Eye Distance:   Bilateral Distance:    Right Eye Near:   Left Eye Near:    Bilateral Near:     Physical Exam Vitals and nursing note reviewed.  Constitutional:      General: She is not in acute distress.    Appearance: Normal appearance. She is not ill-appearing or toxic-appearing.  HENT:     Head: Normocephalic and atraumatic.     Right Ear: Tympanic membrane, ear canal and external ear normal.     Left Ear: Tympanic membrane, ear canal and external ear normal.     Nose: Congestion and rhinorrhea present.     Mouth/Throat:     Mouth: Mucous membranes are moist.     Pharynx: Oropharynx is clear. Posterior oropharyngeal erythema present. No oropharyngeal exudate.  Eyes:     General: No scleral icterus.    Extraocular Movements: Extraocular movements intact.  Cardiovascular:     Rate and Rhythm: Normal rate and regular rhythm.  Pulmonary:     Effort: Pulmonary effort is normal. No respiratory distress.     Breath sounds: Decreased breath sounds and wheezing present. No rhonchi or rales.  Abdominal:     General: Abdomen is flat. Bowel sounds are normal. There is no distension.      Palpations: Abdomen is soft.  Musculoskeletal:     Cervical back: Normal range of motion and neck supple.  Lymphadenopathy:     Cervical: No cervical adenopathy.  Skin:    General: Skin is warm and dry.     Coloration: Skin is not jaundiced or pale.     Findings: No erythema or rash.  Neurological:  Mental Status: She is alert and oriented to person, place, and time.     Motor: No weakness.  Psychiatric:        Mood and Affect: Mood normal.        Behavior: Behavior normal.     UC Treatments / Results  Labs (all labs ordered are listed, but only abnormal results are displayed) Labs Reviewed - No data to display  EKG   Radiology No results found.  Procedures Procedures (including critical care time)  Medications Ordered in UC Medications - No data to display  Initial Impression / Assessment and Plan / UC Course  I have reviewed the triage vital signs and the nursing notes.  Pertinent labs & imaging results that were available during my care of the patient were reviewed by me and considered in my medical decision making (see chart for details).    *** Final Clinical Impressions(s) / UC Diagnoses   Final diagnoses:  None   Discharge Instructions   None    ED Prescriptions   None    PDMP not reviewed this encounter.

## 2022-12-05 NOTE — Discharge Instructions (Signed)
I will contact you tomorrow if the chest x-ray is suggestive of pneumonia.  In the meantime, recommend using albuterol nebulizer or inhaler every 4-6 hours around-the-clock for the next 48 hours.  If you are sleeping, you do not need to wake up and use it.  In addition, we gave you an injection of steroid medicine today, start taking the oral prednisone tomorrow.  Also start taking the azithromycin and take as prescribed to treat for lung inflammation/infection.  Some things that can make you feel better are: - Increased rest - Increasing fluid with water/sugar free electrolytes - Acetaminophen as needed for fever/pain.  - Salt water gargling, chloraseptic spray and throat lozenges - OTC guaifenesin (Mucinex).  - Saline sinus flushes or a neti pot.  - Humidifying the air

## 2022-12-05 NOTE — ED Triage Notes (Signed)
Pt presents with SOB, cough, fatigue, wheezing that started Saturday. Pt daughter states they called EMS and they wanted her to go to the hospital but pt refused and agreed to go to urgent care instead. Taking ibuprofen and used albuterol inhaler.

## 2022-12-09 ENCOUNTER — Telehealth: Payer: Self-pay | Admitting: Nurse Practitioner

## 2022-12-09 MED ORDER — BENZONATATE 100 MG PO CAPS: 100.0000 mg | ORAL_CAPSULE | Freq: Three times a day (TID) | ORAL | 0 refills | Status: DC | PRN

## 2022-12-09 NOTE — Telephone Encounter (Signed)
Patient called requesting something for ongoing cough - Tessalon sent to pharmacy

## 2022-12-11 ENCOUNTER — Telehealth: Payer: Self-pay

## 2022-12-11 NOTE — Telephone Encounter (Signed)
Pt called wanting an inhaler after her visit on 12/2. After speaking with provider, provider stated she would have to be reseen if she is still having sxs of bronchitis. After informing patient of our conversation she became upset stating she will not come in and be reseen and that I am being rude to her because I told her "ok that's fine I understand."   Pt declined to be reseen

## 2022-12-14 DIAGNOSIS — J0100 Acute maxillary sinusitis, unspecified: Secondary | ICD-10-CM | POA: Diagnosis not present

## 2023-03-10 DIAGNOSIS — H25813 Combined forms of age-related cataract, bilateral: Secondary | ICD-10-CM | POA: Diagnosis not present

## 2023-03-16 DIAGNOSIS — H25812 Combined forms of age-related cataract, left eye: Secondary | ICD-10-CM | POA: Diagnosis not present

## 2023-03-16 DIAGNOSIS — Z01818 Encounter for other preprocedural examination: Secondary | ICD-10-CM | POA: Diagnosis not present

## 2023-03-16 DIAGNOSIS — H25813 Combined forms of age-related cataract, bilateral: Secondary | ICD-10-CM | POA: Diagnosis not present

## 2023-03-16 DIAGNOSIS — H353131 Nonexudative age-related macular degeneration, bilateral, early dry stage: Secondary | ICD-10-CM | POA: Diagnosis not present

## 2023-03-22 ENCOUNTER — Other Ambulatory Visit: Payer: Self-pay

## 2023-03-22 ENCOUNTER — Encounter (HOSPITAL_COMMUNITY): Payer: Self-pay

## 2023-03-22 ENCOUNTER — Emergency Department (HOSPITAL_COMMUNITY)
Admission: EM | Admit: 2023-03-22 | Discharge: 2023-03-23 | Disposition: A | Discharge status: Home / Self Care | Attending: Emergency Medicine | Admitting: Emergency Medicine

## 2023-03-22 DIAGNOSIS — R040 Epistaxis: Secondary | ICD-10-CM | POA: Insufficient documentation

## 2023-03-22 DIAGNOSIS — R739 Hyperglycemia, unspecified: Secondary | ICD-10-CM

## 2023-03-22 LAB — CBC WITH DIFFERENTIAL/PLATELET
Abs Immature Granulocytes: 0.32 10*3/uL — ABNORMAL HIGH (ref 0.00–0.07)
Basophils Absolute: 0.1 10*3/uL (ref 0.0–0.1)
Basophils Relative: 1 %
Eosinophils Absolute: 0.7 10*3/uL — ABNORMAL HIGH (ref 0.0–0.5)
Eosinophils Relative: 6 %
HCT: 46.7 % — ABNORMAL HIGH (ref 36.0–46.0)
Hemoglobin: 15.1 g/dL — ABNORMAL HIGH (ref 12.0–15.0)
Immature Granulocytes: 3 %
Lymphocytes Relative: 21 %
Lymphs Abs: 2.2 10*3/uL (ref 0.7–4.0)
MCH: 28.8 pg (ref 26.0–34.0)
MCHC: 32.3 g/dL (ref 30.0–36.0)
MCV: 89 fL (ref 80.0–100.0)
Monocytes Absolute: 0.6 10*3/uL (ref 0.1–1.0)
Monocytes Relative: 5 %
Neutro Abs: 6.9 10*3/uL (ref 1.7–7.7)
Neutrophils Relative %: 64 %
Platelets: 225 10*3/uL (ref 150–400)
RBC: 5.25 MIL/uL — ABNORMAL HIGH (ref 3.87–5.11)
RDW: 15.1 % (ref 11.5–15.5)
WBC: 10.8 10*3/uL — ABNORMAL HIGH (ref 4.0–10.5)
nRBC: 0 % (ref 0.0–0.2)

## 2023-03-22 LAB — BASIC METABOLIC PANEL
Anion gap: 13 (ref 5–15)
BUN: 16 mg/dL (ref 8–23)
CO2: 24 mmol/L (ref 22–32)
Calcium: 9.6 mg/dL (ref 8.9–10.3)
Chloride: 101 mmol/L (ref 98–111)
Creatinine, Ser: 0.72 mg/dL (ref 0.44–1.00)
GFR, Estimated: >60 mL/min (ref >60)
Glucose, Bld: 119 mg/dL — ABNORMAL HIGH (ref 70–99)
Potassium: 3.9 mmol/L (ref 3.5–5.1)
Sodium: 138 mmol/L (ref 135–145)

## 2023-03-22 MED ORDER — SILVER NITRATE-POT NITRATE 75-25 % EX MISC
1.0000 | Freq: Once | CUTANEOUS | Status: AC
  Administered 2023-03-22: 1 via TOPICAL
  Filled 2023-03-22: qty 10

## 2023-03-22 MED ORDER — LIDOCAINE VISCOUS HCL 2 % MT SOLN
15.0000 mL | Freq: Once | OROMUCOSAL | Status: AC
  Administered 2023-03-22: 15 mL via OROMUCOSAL
  Filled 2023-03-22: qty 15

## 2023-03-22 MED ORDER — SODIUM CHLORIDE 0.9 % IV BOLUS
500.0000 mL | Freq: Once | INTRAVENOUS | Status: AC
  Administered 2023-03-22: 500 mL via INTRAVENOUS

## 2023-03-22 MED ORDER — ONDANSETRON HCL 4 MG/2ML IJ SOLN
4.0000 mg | Freq: Once | INTRAMUSCULAR | Status: AC
  Administered 2023-03-22: 4 mg via INTRAVENOUS
  Filled 2023-03-22: qty 2

## 2023-03-22 MED ORDER — LABETALOL HCL 5 MG/ML IV SOLN: 10.0000 mg | Freq: Once | INTRAVENOUS | Status: DC

## 2023-03-22 MED ORDER — OXYMETAZOLINE HCL 0.05 % NA SOLN
1.0000 | Freq: Once | NASAL | Status: AC
  Administered 2023-03-22: 1 via NASAL
  Filled 2023-03-22: qty 30

## 2023-03-22 NOTE — ED Provider Notes (Signed)
 [facility] Provider Note   CSN: [id number] Arrival date & time: 03/22/23  2050     History  Chief Complaint  Patient presents with   Epistaxis    Ashley Flores is a 74 y.o. female.  Patient is a 73 year old female who presents emergency department the chief complaint of epistaxis.  Patient notes that she has had approximately 4 bouts of epistaxis today.  She does note that she had similar symptoms approximately 2 months ago but did resolve on their own.  She denies any recent falls or blunt facial trauma.  Patient denies any known history of bleeding disorders or current anticoagulation use.  She denies any associated dizziness, lightheadedness or syncope.  Patient notes that she does not take any other medications daily.   Epistaxis      Home Medications Prior to Admission medications   Medication Sig Start Date End Date Taking? Authorizing Provider  albuterol (VENTOLIN HFA) 108 (90 Base) MCG/ACT inhaler Inhale 1-2 puffs into the lungs every 6 (six) hours as needed for wheezing or shortness of breath. 09/25/20   Long, Arlyss Repress, MD  azithromycin (ZITHROMAX) 250 MG tablet Take 1 tablet (250 mg total) by mouth daily. Take first 2 tablets together, then 1 every day until finished. 12/05/22   Valentino Nose, NP  benzonatate (TESSALON) 100 MG capsule Take 1 capsule (100 mg total) by mouth 3 (three) times daily as needed for cough. Do not take with alcohol or while driving or operating heavy machinery.  May cause drowsiness. 12/09/22   Valentino Nose, NP  cetirizine (ZYRTEC) 10 MG tablet Take 1 tablet (10 mg total) by mouth daily. Patient taking differently: Take 10 mg by mouth daily as needed.  11/28/17   Doristine Bosworth, MD      Allergies    Pyridium [phenazopyridine hcl]    Review of Systems   Review of Systems  HENT:  Positive for nosebleeds.   All other systems reviewed and are negative.   Physical Exam Updated Vital  Signs BP (!) 171/71 (BP Location: Right Arm)   Pulse 72   Temp 98.5 F (36.9 C) (Oral)   Resp 18   Ht 5\' 2"  (1.575 m)   Wt 111.1 kg   SpO2 99%   BMI 44.81 kg/m  Physical Exam Vitals and nursing note reviewed.  Constitutional:      Appearance: Normal appearance.  HENT:     Head: Normocephalic and atraumatic.     Nose:     Comments: Bleeding noted from the left nare, unable to visualize source of bleeding, right nare with no active indication for bleeding    Mouth/Throat:     Mouth: Mucous membranes are moist.  Eyes:     Extraocular Movements: Extraocular movements intact.     Conjunctiva/sclera: Conjunctivae normal.     Pupils: Pupils are equal, round, and reactive to light.  Cardiovascular:     Rate and Rhythm: Normal rate and regular rhythm.     Pulses: Normal pulses.     Heart sounds: Normal heart sounds.  Pulmonary:     Effort: Pulmonary effort is normal.     Breath sounds: Normal breath sounds.  Musculoskeletal:        General: Normal range of motion.     Cervical back: Normal range of motion and neck supple.  Skin:    General: Skin is warm and dry.  Neurological:     General: No focal deficit present.  Mental Status: She is alert and oriented to person, place, and time. Mental status is at baseline.  Psychiatric:        Mood and Affect: Mood normal.        Behavior: Behavior normal.        Thought Content: Thought content normal.        Judgment: Judgment normal.     ED Results / Procedures / Treatments   Labs (all labs ordered are listed, but only abnormal results are displayed) Labs Reviewed  CBC WITH DIFFERENTIAL/PLATELET  BASIC METABOLIC PANEL    EKG None  Radiology No results found.  Procedures Epistaxis Management  Date/Time: 03/22/2023 11:00 PM  Performed by: Lelon Perla, PA-C Authorized by: Lelon Perla, PA-C   Consent:    Consent obtained:  Verbal   Consent given by:  Patient   Risks, benefits, and alternatives  were discussed: yes     Risks discussed:  Bleeding, nasal injury and pain   Alternatives discussed:  No treatment, delayed treatment and alternative treatment Universal protocol:    Procedure explained and questions answered to patient or proxy's satisfaction: yes     Relevant documents present and verified: yes     Patient identity confirmed:  Verbally with patient, arm band and provided demographic data Anesthesia:    Anesthesia method:  Topical application   Topical anesthetic:  Lidocaine gel Procedure details:    Treatment site:  R anterior   Treatment method:  Anterior pack   Treatment complexity:  Limited   Treatment episode: initial   Post-procedure details:    Assessment:  Bleeding stopped   Procedure completion:  Tolerated well, no immediate complications     Medications Ordered in ED Medications  ondansetron (ZOFRAN) injection 4 mg (has no administration in time range)  lidocaine (XYLOCAINE) 2 % viscous mouth solution 15 mL (15 mLs Mouth/Throat Given 03/22/23 2217)  oxymetazoline (AFRIN) 0.05 % nasal spray 1 spray (1 spray Each Nare Given 03/22/23 2217)  silver nitrate applicators applicator 1 Application (1 Application Topical Given 03/22/23 2217)  sodium chloride 0.9 % bolus 500 mL (500 mLs Intravenous New Bag/Given 03/22/23 2258)    ED Course/ Medical Decision Making/ A&P                                 Medical Decision Making Patient is doing well at this time. After placement of the rhinorocket patient has had no further bleeding. Blood pressure has improved at this time as well.  Blood work demonstrates no anemia.  Patient is not currently anticoagulated at this time.  Will sign patient out to oncoming provider Dr. Preston Fleeting for reevaluation and discharge for follow-up with ENT.  Amount and/or Complexity of Data Reviewed Labs: ordered.  Risk OTC drugs. Prescription drug management.           Final Clinical Impression(s) / ED Diagnoses Final diagnoses:   None    Rx / DC Orders ED Discharge Orders     None         Kathlen Mody 03/22/23 2323    Bethann Berkshire, MD 03/24/23 [phone]

## 2023-03-22 NOTE — ED Provider Notes (Signed)
 Care is assumed from [facility], patient with epistaxis treated with nasal packing.  Hemoglobin is stable, platelet count normal, basic metabolic panel pending.  She will need to be rechecked to make sure that bleeding is controlled.  12:31 AM I have reevaluated the patient, and no recurrence of bleeding.  I have reviewed her additional laboratory tests, and my interpretation is elevated random glucose level which will need to be followed as an outpatient.  I am referring her to ENT for follow-up.  She does state that she has cataract surgery scheduled for 1 week.  I do not think that the nosebleed will impact her cataract surgery, but I have recommended that she contact her surgeon to see if they wish to delay her surgery.  Results for orders placed or performed during the hospital encounter of 03/22/23  CBC with Differential   Collection Time: 03/22/23 10:50 PM  Result Value Ref Range   WBC 10.8 (H) 4.0 - 10.5 K/uL   RBC 5.25 (H) 3.87 - 5.11 MIL/uL   Hemoglobin 15.1 (H) 12.0 - 15.0 g/dL   HCT 25.3 (H) 66.4 - 40.3 %   MCV 89.0 80.0 - 100.0 fL   MCH 28.8 26.0 - 34.0 pg   MCHC 32.3 30.0 - 36.0 g/dL   RDW 47.4 25.9 - 56.3 %   Platelets 225 150 - 400 K/uL   nRBC 0.0 0.0 - 0.2 %   Neutrophils Relative % 64 %   Neutro Abs 6.9 1.7 - 7.7 K/uL   Lymphocytes Relative 21 %   Lymphs Abs 2.2 0.7 - 4.0 K/uL   Monocytes Relative 5 %   Monocytes Absolute 0.6 0.1 - 1.0 K/uL   Eosinophils Relative 6 %   Eosinophils Absolute 0.7 (H) 0.0 - 0.5 K/uL   Basophils Relative 1 %   Basophils Absolute 0.1 0.0 - 0.1 K/uL   Immature Granulocytes 3 %   Abs Immature Granulocytes 0.32 (H) 0.00 - 0.07 K/uL  Basic metabolic panel   Collection Time: 03/22/23 10:50 PM  Result Value Ref Range   Sodium 138 135 - 145 mmol/L   Potassium 3.9 3.5 - 5.1 mmol/L   Chloride 101 98 - 111 mmol/L   CO2 24 22 - 32 mmol/L   Glucose, Bld 119 (H) 70 - 99 mg/dL   BUN 16 8 - 23 mg/dL   Creatinine, Ser 8.75 0.44 - 1.00  mg/dL   Calcium 9.6 8.9 - 64.3 mg/dL   GFR, Estimated >32 >95 mL/min   Anion gap 13 5 - 15   No results found.    Dione Booze, MD 03/23/23 [phone]

## 2023-03-22 NOTE — ED Triage Notes (Signed)
 Pt to ED c/o nosebleeds today, pt says she has had four episodes today, currently nose is not bleeding, pt reports she is NOT on blood thinner

## 2023-03-23 ENCOUNTER — Emergency Department (HOSPITAL_COMMUNITY)
Admission: EM | Admit: 2023-03-23 | Discharge: 2023-03-23 | Disposition: A | Discharge status: Home / Self Care | Source: Home / Self Care | Attending: Emergency Medicine | Admitting: Emergency Medicine

## 2023-03-23 ENCOUNTER — Telehealth (INDEPENDENT_AMBULATORY_CARE_PROVIDER_SITE_OTHER): Payer: Self-pay | Admitting: Otolaryngology

## 2023-03-23 ENCOUNTER — Encounter (HOSPITAL_COMMUNITY): Payer: Self-pay

## 2023-03-23 ENCOUNTER — Other Ambulatory Visit: Payer: Self-pay

## 2023-03-23 DIAGNOSIS — R040 Epistaxis: Secondary | ICD-10-CM | POA: Insufficient documentation

## 2023-03-23 MED ORDER — SALINE SPRAY 0.65 % NA SOLN: 1.0000 | Freq: Three times a day (TID) | NASAL | 0 refills | Status: AC

## 2023-03-23 MED ORDER — AMOXICILLIN-POT CLAVULANATE 875-125 MG PO TABS: 1.0000 | ORAL_TABLET | Freq: Two times a day (BID) | ORAL | 0 refills | Status: DC

## 2023-03-23 MED ORDER — WHITE PETROLATUM EX OINT: 1.0000 | TOPICAL_OINTMENT | Freq: Two times a day (BID) | CUTANEOUS | 0 refills | Status: AC

## 2023-03-23 MED ORDER — AMOXICILLIN-POT CLAVULANATE 875-125 MG PO TABS: 1.0000 | ORAL_TABLET | Freq: Two times a day (BID) | ORAL | 0 refills | Status: AC

## 2023-03-23 MED ORDER — AMOXICILLIN-POT CLAVULANATE 875-125 MG PO TABS
1.0000 | ORAL_TABLET | Freq: Once | ORAL | Status: AC
  Administered 2023-03-23: 1 via ORAL
  Filled 2023-03-23: qty 1

## 2023-03-23 MED ORDER — ACETAMINOPHEN 500 MG PO TABS
1000.0000 mg | ORAL_TABLET | Freq: Once | ORAL | Status: AC
  Administered 2023-03-23: 1000 mg via ORAL
  Filled 2023-03-23: qty 2

## 2023-03-23 NOTE — Telephone Encounter (Signed)
 Daughter - Estill Batten [phone]  Called concerned about patient.  Patient was seen in the ED this morning and Dr. Irene Pap cauterized her nose.  Patient is in pain.  Daughter is wondering if that is normal.  Please advise.

## 2023-03-23 NOTE — ED Provider Notes (Addendum)
 [place] EMERGENCY DEPARTMENT AT [facility] Provider Note   CSN: [id number] Arrival date & time: 03/23/23  5621     History  Chief Complaint  Patient presents with   Epistaxis    Ashley Flores is a 73 y.o. female with PMHx anxiety who presents to ED concerned for hemoptysis and nasal packing concerns. Patient presented to [facility] ED last night concerned for epistaxis. Apparently, patient experienced a lot of post nasal bleeding while epistaxis was being controlled in ED - which resulted in a couple episodes of vomiting up blood. Rhinorocket was successfully placed, and patient no longer had epistaxis, so she was discharged home with ENT follow up. Patient then had on episode of hemoptysis which produced one blood clot after she got home.   Patient with severe anxiety in the ED stating that she has severe claustrophobia and does not like being stuck inside a building where she cannot get to an exit. Patient also stating that she is having a very hard time breathing d/t the packing in her left nostril and the congestion in her right nostril. Patient denies any other symptoms at this time.      Epistaxis      Home Medications Prior to Admission medications   Medication Sig Start Date End Date Taking? Authorizing Provider  amoxicillin-clavulanate (AUGMENTIN) 875-125 MG tablet Take 1 tablet by mouth every 12 (twelve) hours for 7 days. 03/23/23 03/30/23 Yes Amari Zagal, Charlotte Sanes F, PA-C  sodium chloride (OCEAN) 0.65 % SOLN nasal spray Place 1 spray into both nostrils 3 (three) times daily. 03/23/23  Yes Aarvi Stotts, Charlotte Sanes F, PA-C  white petrolatum (VASELINE) OINT Apply 1 Application topically 2 (two) times daily. Both nares. 03/23/23  Yes Valrie Hart F, PA-C  albuterol (VENTOLIN HFA) 108 (90 Base) MCG/ACT inhaler Inhale 1-2 puffs into the lungs every 6 (six) hours as needed for wheezing or shortness of breath. 09/25/20   Long, Arlyss Repress, MD  cetirizine (ZYRTEC) 10 MG tablet  Take 1 tablet (10 mg total) by mouth daily. Patient taking differently: Take 10 mg by mouth daily as needed.  11/28/17   Doristine Bosworth, MD      Allergies    Pyridium [phenazopyridine hcl]    Review of Systems   Review of Systems  HENT:  Positive for nosebleeds.     Physical Exam Updated Vital Signs BP 135/61   Pulse 70   Temp 97.7 F (36.5 C) (Oral)   Resp 16   Ht 5\' 2"  (1.575 m)   Wt 111.1 kg   SpO2 97%   BMI 44.81 kg/m  Physical Exam Vitals and nursing note reviewed.  Constitutional:      General: She is not in acute distress.    Appearance: She is not ill-appearing or toxic-appearing.  HENT:     Head: Normocephalic and atraumatic.     Mouth/Throat:     Mouth: Mucous membranes are moist.     Pharynx: Oropharynx is clear. No oropharyngeal exudate or posterior oropharyngeal erythema.     Comments: Oropharynx is clear from blood or other lesions. Rhinorocket appropriately placed in left nostril. Eyes:     General: No scleral icterus.       Right eye: No discharge.        Left eye: No discharge.     Conjunctiva/sclera: Conjunctivae normal.  Cardiovascular:     Rate and Rhythm: Normal rate.  Pulmonary:     Effort: Pulmonary effort is normal.  Abdominal:  General: Abdomen is flat.  Skin:    General: Skin is warm and dry.  Neurological:     General: No focal deficit present.     Mental Status: She is alert. Mental status is at baseline.  Psychiatric:        Mood and Affect: Mood normal.        Behavior: Behavior normal.     ED Results / Procedures / Treatments   Labs (all labs ordered are listed, but only abnormal results are displayed) Labs Reviewed - No data to display  EKG None  Radiology No results found.  Procedures Procedures    Medications Ordered in ED Medications  amoxicillin-clavulanate (AUGMENTIN) 875-125 MG per tablet 1 tablet (1 tablet Oral Given 03/23/23 0859)  acetaminophen (TYLENOL) tablet 1,000 mg (1,000 mg Oral Given 03/23/23  0859)    ED Course/ Medical Decision Making/ A&P                                 Medical Decision Making Risk OTC drugs. Prescription drug management.   This patient presents to the ED for concern of congestion and hemoptysis, this involves an extensive number of treatment options, and is a complaint that carries with it a high risk of complications and morbidity.  The differential diagnosis includes bleeding/epistaxis, anemia, congestion   Co morbidities that complicate the patient evaluation  anxiety   Additional history obtained: Additional history obtained from 3/19 ED note: patient with epistaxis appropriately treated with Rhinorocket 3/19 CBC: no anemia   Problem List / ED Course / Critical interventions / Medication management  Patient presenting to ED concerned for 1 episode of mild hemoptysis since Rhinorocket was placed by ED provider last night. Patient coughed up one blood clot. Patient also with anxiety and stating that she cannot breath d/t the Rhinorocket in left nostril and congestion in right nostril. Patient denies any other symptoms currently. Physical exam without concern for bleeding in posterior oropharynx. Rest of physical exam reassuring. Patient afebrile with stable vitals. Patient's family member stating that they were told to come to [facility] ED so that the ENT on-call provider could evaluate them. I requested consultation with the ENT on-call Dr. Irene Pap,  and discussed lab and imaging findings as well as pertinent plan - they recommend: pain control, Augmentin for nasal packing, intranasal Ocean Spray TID, intranasal Vaseline BID, and follow up with outpatient ENT appointment. I appreciate their help. Patient tolerated first dose of Augmentin and tylenol well in ED. Educated patient on alternating Advil and Tylenol for pain control.  It appears that patient's one episode of hemoptysis was d/t the post nasal bleeding that she was experiencing before her  epistaxis was resolved. Physical exam without concern for acute bleeding. No other concerns for hemoptysis or hematemesis since 3AM. Dr. Irene Pap agrees that it appears that patient coughed up left-over bloody mucous/blood clot. Educated patient on plan. Answered all questions. Recommended following up with already established ENT apt. Also recommended following up with PCP. Patient verbalized understanding of plan. Staffed with Dr. Jeraldine Loots. I have reviewed the patients home medicines and have made adjustments as needed Patient afebrile with stable vitals. Provided with return precautions. Discharged in good condition.   Social Determinants of Health:  geriatric          Final Clinical Impression(s) / ED Diagnoses Final diagnoses:  Epistaxis    Rx / DC Orders ED Discharge Orders  Ordered    amoxicillin-clavulanate (AUGMENTIN) 875-125 MG tablet  Every 12 hours        03/23/23 0905    sodium chloride (OCEAN) 0.65 % SOLN nasal spray  3 times daily        03/23/23 0908    white petrolatum (VASELINE) OINT  2 times daily        03/23/23 0908                 Dorthy Cooler, [place] 03/23/23 0915    Gerhard Munch, MD 03/23/23 [phone]

## 2023-03-23 NOTE — ED Triage Notes (Signed)
 Pt states she has had multiple nose bleeds today, was seen in at [facility] today. Pt had rhino rocket placed & instructed to follow up with ENT.  Pt states when she lays down she is suffocating from the blood going down her throat. Feels like there is a clot in her right nostril.

## 2023-03-23 NOTE — Discharge Instructions (Addendum)
 It was a pleasure caring for you today. As discussed, please follow up with ENT appointment early next week. Seek emergency care if experiencing any new or worsening symptoms.   Alternating between 650 mg Tylenol and 400 mg Advil: The best way to alternate taking Acetaminophen (example Tylenol) and Ibuprofen (example Advil/Motrin) is to take them 3 hours apart. For example, if you take ibuprofen at 6 am you can then take Tylenol at 9 am. You can continue this regimen throughout the day, making sure you do not exceed the recommended maximum dose for each drug.

## 2023-03-23 NOTE — Discharge Instructions (Signed)
 The RapidRhino should be removed in 3-5 days.  Please see the ear nose throat physician to have that done.  Please contact your eye surgeon to let them know that you have a nosebleed to see if they want to reschedule your cataract surgery.  Return if bleeding starts back again.

## 2023-03-23 NOTE — ED Notes (Signed)
 ENT at bedside

## 2023-03-26 DIAGNOSIS — L231 Allergic contact dermatitis due to adhesives: Secondary | ICD-10-CM | POA: Diagnosis not present

## 2023-03-27 ENCOUNTER — Ambulatory Visit (INDEPENDENT_AMBULATORY_CARE_PROVIDER_SITE_OTHER): Admitting: Otolaryngology

## 2023-03-27 ENCOUNTER — Encounter (INDEPENDENT_AMBULATORY_CARE_PROVIDER_SITE_OTHER): Payer: Self-pay | Admitting: Otolaryngology

## 2023-03-27 VITALS — BP 170/78 | HR 86 | Ht 62.0 in

## 2023-03-27 DIAGNOSIS — J3089 Other allergic rhinitis: Secondary | ICD-10-CM

## 2023-03-27 DIAGNOSIS — R0982 Postnasal drip: Secondary | ICD-10-CM

## 2023-03-27 DIAGNOSIS — R040 Epistaxis: Secondary | ICD-10-CM

## 2023-03-27 DIAGNOSIS — R0981 Nasal congestion: Secondary | ICD-10-CM

## 2023-03-27 DIAGNOSIS — J343 Hypertrophy of nasal turbinates: Secondary | ICD-10-CM

## 2023-03-27 DIAGNOSIS — J342 Deviated nasal septum: Secondary | ICD-10-CM

## 2023-03-27 MED ORDER — OXYMETAZOLINE HCL 0.05 % NA SOLN: 1.0000 | Freq: Two times a day (BID) | NASAL | 0 refills | Status: AC

## 2023-03-27 NOTE — Progress Notes (Signed)
 ENT CONSULT:  Reason for Consult: epistaxis  left side  HPI: Discussed the use of AI scribe software for clinical note transcription with the patient, who gave verbal consent to proceed.  History of Present Illness Ashley Flores is a 73 year old female who presents after an episode of right sided severe epistaxis 03/22/23 requiring packing in the ED.  She has experienced severe epistaxis that began suddenly, necessitating emergency room visits 4 days ago. Blood was described as 'pouring out both sides' of her nose, and she was 'actually throwing up blood.' The episodes occurred multiple times in one day, with each episode worsening, often triggered by bending over or activities like cleaning the bathtub. No prior history of epistaxis and no known risk factors such as blood thinners or uncontrolled hypertension.  She is scheduled for cataract surgery and is concerned about proceeding due to the recent epistaxis.  She reports chronic nasal congestion. She has not used medication for allergies or nasal congestion previously.  She uses saline spray and Vaseline to manage nasal dryness and prevent further bleeding. She is concerned about the possibility of future epistaxis. Had left sided rhino rocket placed 3/19 with no epistaxis after she was discharged.   Records Reviewed:  ED note 03/22/2023 Care is assumed from [facility], patient with epistaxis treated with nasal packing.  Hemoglobin is stable, platelet count normal, basic metabolic panel pending.  She will need to be rechecked to make sure that bleeding is controlled.   12:31 AM I have reevaluated the patient, and no recurrence of bleeding.  I have reviewed her additional laboratory tests, and my interpretation is elevated random glucose level which will need to be followed as an outpatient.  I am referring her to ENT for follow-up.  She does state that she has cataract surgery scheduled for 1 week.  I do not think that the  nosebleed will impact her cataract surgery, but I have recommended that she contact her surgeon to see if they wish to delay her surgery.  Patient is a 73 year old female who presents emergency department the chief complaint of epistaxis. Patient notes that she has had approximately 4 bouts of epistaxis today. She does note that she had similar symptoms approximately 2 months ago but did resolve on their own. She denies any recent falls or blunt facial trauma. Patient denies any known history of bleeding disorders or current anticoagulation use. She denies any associated dizziness, lightheadedness or syncope. Patient notes that she does not take any other medications daily.     Past Medical History:  Diagnosis Date   Cancer [facility])    endometrial cancer   Obesity    Ventral hernia     Past Surgical History:  Procedure Laterality Date   laparoscopic surgery for fertility  40 yrs ago   LYMPH NODE BIOPSY N/A 04/07/2015   Procedure:  SENTINEL LYMPH NODE BIOPSY;  Surgeon: Adolphus Birchwood, MD;  Location: [facility] ORS;  Service: Gynecology;  Laterality: N/A;   ROBOTIC ASSISTED TOTAL HYSTERECTOMY WITH BILATERAL SALPINGO OOPHERECTOMY Bilateral 04/07/2015   Procedure: XI ROBOTIC ASSISTED TOTAL LAPAROSCOPIC  HYSTERECTOMY WITH BILATERAL SALPINGO OOPHORECTOMY;  Surgeon: Adolphus Birchwood, MD;  Location: [facility] ORS;  Service: Gynecology;  Laterality: Bilateral;   VENTRAL HERNIA REPAIR N/A 04/07/2015   Procedure: LAPAROSCOPIC VENTRAL HERNIA  REPAIR WITH MESH;  Surgeon: Luretha Murphy, MD;  Location: [facility] ORS;  Service: General;  Laterality: N/A;    Family History  Problem Relation Age of Onset   Cancer Mother  Hypertension Mother    Stroke Mother    Lung disease Neg Hx     Social History:  reports that she has never smoked. She has never used smokeless tobacco. She reports that she does not drink alcohol and does not use drugs.  Allergies:  Allergies  Allergen Reactions   Pyridium [Phenazopyridine Hcl] Other (See Comments)     Made pt feel out of it, room spinning, hallucinations.     Medications: I have reviewed the patient's current medications.  The PMH, PSH, Medications, Allergies, and SH were reviewed and updated.  ROS: Constitutional: Negative for fever, weight loss and weight gain. Cardiovascular: Negative for chest pain and dyspnea on exertion. Respiratory: Is not experiencing shortness of breath at rest. Gastrointestinal: Negative for nausea and vomiting. Neurological: Negative for headaches. Psychiatric: The patient is not nervous/anxious  Blood pressure (!) 170/78, pulse 86, height 5\' 2"  (1.575 m), SpO2 97%. Body mass index is 44.81 kg/m.  PHYSICAL EXAM:  Exam: General: Well-developed, well-nourished Respiratory Respiratory effort: Equal inspiration and expiration without stridor Cardiovascular Peripheral Vascular: Warm extremities with equal color/perfusion Eyes: No nystagmus with equal extraocular motion bilaterally Neuro/Psych/Balance: Patient oriented to person, place, and time; Appropriate mood and affect; Gait is intact with no imbalance; Cranial nerves I-XII are intact Head and Face Inspection: Normocephalic and atraumatic without mass or lesion Palpation: Facial skeleton intact without bony stepoffs Salivary Glands: No mass or tenderness Facial Strength: Facial motility symmetric and full bilaterally ENT Pinna: External ear intact and fully developed External canal: Canal is patent with intact skin Tympanic Membrane: Clear and mobile External Nose: No scar or anatomic deformity Internal Nose: Septum is S-shaped and deviated to the left with significant narrowing b/l. No polyp, or purulence. Mucosal edema and erythema present.  Bilateral inferior turbinate hypertrophy.  No prominent blood vessels along anterior septum b/l. No blood or clot. Minor bruising along the left MT likely from Rhinorocket placement. No epistaxis. Lips, Teeth, and gums: Mucosa and teeth intact and  viable TMJ: No pain to palpation with full mobility Oral cavity/oropharynx: No erythema or exudate, no lesions present No blood or clot  Neck Neck and Trachea: Midline trachea without mass or lesion Thyroid: No mass or nodularity Lymphatics: No lymphadenopathy  Procedure:   PROCEDURE NOTE: nasal endoscopy  Preoperative diagnosis: chronic nasal congestion and left sided epistaxis requiring Rhino Rocket placement   Postoperative diagnosis: same  Procedure: Diagnostic nasal endoscopy (40981)  Surgeon: Ashok Croon, M.D.  Anesthesia: Topical lidocaine and Afrin  H&P REVIEW: The patient's history and physical were reviewed today prior to procedure. All medications were reviewed and updated as well. Complications: None Condition is stable throughout exam Indications and consent: The patient presents with symptoms of chronic sinusitis not responding to previous therapies. All the risks, benefits, and potential complications were reviewed with the patient preoperatively and informed consent was obtained. The time out was completed with confirmation of the correct procedure.   Procedure: The patient was seated upright in the clinic. Topical lidocaine and Afrin were applied to the nasal cavity. After adequate anesthesia had occurred, the rigid nasal endoscope was passed into the nasal cavity. The nasal mucosa, turbinates, septum, and sinus drainage pathways were visualized bilaterally. This revealed no purulence or significant secretions that might be cultured. There were no polyps or sites of significant inflammation. The mucosa was intact and there was no crusting present. The scope was then slowly withdrawn and the patient tolerated the procedure well. There were no complications or blood loss.  Studies Reviewed: CXR 12/05/2022 FINDINGS: There is diffuse chronic intra coarsening and bronchitic changes, progressed since the prior radiograph. Atypical infiltrate is not excluded. No  focal consolidation, pleural effusion, pneumothorax. The cardiac silhouette is within normal limits. No acute osseous pathology.   IMPRESSION: Chronic interstitial coarsening and bronchitic changes. Atypical infiltrate is not excluded.  Assessment/Plan: Encounter Diagnoses  Name Primary?   Epistaxis Yes   Chronic nasal congestion    Environmental and seasonal allergies    Hypertrophy of both inferior nasal turbinates    Nasal septal deviation    Post-nasal drip     Assessment and Plan Assessment & Plan Epistaxis R side, resolved s/p Rhinorocket 03/22/23 Significant epistaxis controlled with nasal packing. Deviated septum contributes to risk. No anticoagulants and controlled hypertension are favorable for recovery. Discussed Afrin and BleedStop as alternatives to ER visits. - Use saline nasal spray 4-5 times daily. - Apply Vaseline to nostrils, avoid deep insertion. - Consider humidifier for air moisture. - Prescribe Afrin for epistaxis, apply pressure 10-15 minutes. - Obtain BleedStop if Afrin and pressure fail. - Avoid strenuous activities and bending over while healing - Gargle with cold water during epistaxis. - Avoid forceful nose blowing, sneeze with open mouth.  Chronic Nasal Congestion Nasal congestion likely due to allergies, exacerbated by deviated septum. Flonase not prescribed due to epistaxis risk. - Reassess in a month to consider Flonase if congestion persists and epistaxis resolved. - Zyrtec 10 mg daily   Cataract Surgery Consideration Cataract surgery deemed safe. Recent epistaxis does not increase surgical risk. - Proceed with cataract surgery as planned.   Thank you for allowing me to participate in the care of this patient. Please do not hesitate to contact me with any questions or concerns.   Ashok Croon, MD Otolaryngology [facility] Phone: [phone] Fax: [phone]    03/28/2023, 12:36 PM

## 2023-03-27 NOTE — Patient Instructions (Addendum)
 Epistaxis prevention instructions given to the patient: - use nasal saline spray x6/day and Vaseline twice a day to prevent nose bleeds - for active nose bleeds use Afrin and nasal tip pressure x 10 min to stop them - if nose bleed does not stop with above measures, please go to Emergency Room  - please see your primary care provider to check your blood pressure and make sure it is under control - return for recurrent nose bleeds and we will consider cautery of your nasal blood vessels  - Purchase BleedStop to have at home and help with epistaxis control  - at a time of a nose bleed start gargle with ice cold water to help stop the bleeding +

## 2023-04-13 ENCOUNTER — Telehealth: Payer: Self-pay | Admitting: Otolaryngology

## 2023-04-13 DIAGNOSIS — H25812 Combined forms of age-related cataract, left eye: Secondary | ICD-10-CM | POA: Diagnosis not present

## 2023-04-13 DIAGNOSIS — H2512 Age-related nuclear cataract, left eye: Secondary | ICD-10-CM | POA: Diagnosis not present

## 2023-04-13 NOTE — Telephone Encounter (Signed)
 Patients daughter called saying mother had cataract surgery and is having nose bleeds, and would like to make sure there is nothing they need to do or if they need to come in, please advise

## 2023-04-13 NOTE — Telephone Encounter (Signed)
 Patient called stating that she had cataract surgery today, she had a nasal canula with oxygen, she said when she got home she had a nose bleed, Afrin was not helping to control it, she tried bleed stop but had some trouble.  I advised her to moisten her nose with vasaline, she can also gargle with ice water to help stop the bleeding, she can use gauze as well and to notify the surgeon as well.  I advised her to call back if she continues to have difficulty.

## 2023-04-19 DIAGNOSIS — Z131 Encounter for screening for diabetes mellitus: Secondary | ICD-10-CM | POA: Diagnosis not present

## 2023-04-19 DIAGNOSIS — Z6841 Body Mass Index (BMI) 40.0 and over, adult: Secondary | ICD-10-CM | POA: Diagnosis not present

## 2023-04-19 DIAGNOSIS — Z1329 Encounter for screening for other suspected endocrine disorder: Secondary | ICD-10-CM | POA: Diagnosis not present

## 2023-04-19 DIAGNOSIS — R040 Epistaxis: Secondary | ICD-10-CM | POA: Diagnosis not present

## 2023-04-19 DIAGNOSIS — I10 Essential (primary) hypertension: Secondary | ICD-10-CM | POA: Diagnosis not present

## 2023-04-19 DIAGNOSIS — Z1159 Encounter for screening for other viral diseases: Secondary | ICD-10-CM | POA: Diagnosis not present

## 2023-04-19 DIAGNOSIS — Z1321 Encounter for screening for nutritional disorder: Secondary | ICD-10-CM | POA: Diagnosis not present

## 2023-04-19 DIAGNOSIS — Z13220 Encounter for screening for lipoid disorders: Secondary | ICD-10-CM | POA: Diagnosis not present

## 2023-04-19 DIAGNOSIS — Z0001 Encounter for general adult medical examination with abnormal findings: Secondary | ICD-10-CM | POA: Diagnosis not present

## 2023-04-27 DIAGNOSIS — I10 Essential (primary) hypertension: Secondary | ICD-10-CM | POA: Diagnosis not present

## 2023-04-27 DIAGNOSIS — H25811 Combined forms of age-related cataract, right eye: Secondary | ICD-10-CM | POA: Diagnosis not present

## 2023-04-27 DIAGNOSIS — H2511 Age-related nuclear cataract, right eye: Secondary | ICD-10-CM | POA: Diagnosis not present

## 2023-05-03 DIAGNOSIS — Z79899 Other long term (current) drug therapy: Secondary | ICD-10-CM | POA: Diagnosis not present

## 2023-05-03 DIAGNOSIS — I10 Essential (primary) hypertension: Secondary | ICD-10-CM | POA: Diagnosis not present

## 2023-05-18 DIAGNOSIS — I10 Essential (primary) hypertension: Secondary | ICD-10-CM | POA: Diagnosis not present

## 2023-06-01 DIAGNOSIS — Z6841 Body Mass Index (BMI) 40.0 and over, adult: Secondary | ICD-10-CM | POA: Diagnosis not present

## 2023-06-01 DIAGNOSIS — I10 Essential (primary) hypertension: Secondary | ICD-10-CM | POA: Diagnosis not present

## 2023-08-03 DIAGNOSIS — I10 Essential (primary) hypertension: Secondary | ICD-10-CM | POA: Diagnosis not present

## 2023-08-03 DIAGNOSIS — Z6841 Body Mass Index (BMI) 40.0 and over, adult: Secondary | ICD-10-CM | POA: Diagnosis not present

## 2023-11-01 DIAGNOSIS — E785 Hyperlipidemia, unspecified: Secondary | ICD-10-CM | POA: Diagnosis not present

## 2023-11-01 DIAGNOSIS — I10 Essential (primary) hypertension: Secondary | ICD-10-CM | POA: Diagnosis not present

## 2023-11-01 DIAGNOSIS — R7309 Other abnormal glucose: Secondary | ICD-10-CM | POA: Diagnosis not present

## 2023-11-01 DIAGNOSIS — R7301 Impaired fasting glucose: Secondary | ICD-10-CM | POA: Diagnosis not present

## 2023-11-01 DIAGNOSIS — Z6841 Body Mass Index (BMI) 40.0 and over, adult: Secondary | ICD-10-CM | POA: Diagnosis not present

## 2023-11-01 DIAGNOSIS — E1165 Type 2 diabetes mellitus with hyperglycemia: Secondary | ICD-10-CM | POA: Diagnosis not present

## 2023-11-01 DIAGNOSIS — E559 Vitamin D deficiency, unspecified: Secondary | ICD-10-CM | POA: Diagnosis not present

## 2024-02-07 NOTE — Progress Notes (Signed)
 Ashley Flores                                          MRN: [id number]   02/07/2024   The [facility] Quality Team Specialist reviewed this patient medical record for the purposes of chart review for care gap closure. The following were reviewed: chart review for care gap closure-breast cancer screening.    [facility] Quality Team
